# Patient Record
Sex: Female | Born: 1970 | Race: Black or African American | Hispanic: No | Marital: Married | Smoking: Never smoker
Health system: Southern US, Community
[De-identification: ages and names within clinical notes are randomized; demographics above are authoritative.]

## PROBLEM LIST (undated history)

## (undated) DIAGNOSIS — M545 Low back pain, unspecified: Secondary | ICD-10-CM

## (undated) DIAGNOSIS — G43909 Migraine, unspecified, not intractable, without status migrainosus: Secondary | ICD-10-CM

## (undated) DIAGNOSIS — G8929 Other chronic pain: Secondary | ICD-10-CM

---

## 2011-12-08 ENCOUNTER — Encounter (HOSPITAL_BASED_OUTPATIENT_CLINIC_OR_DEPARTMENT_OTHER): Payer: Self-pay

## 2011-12-08 ENCOUNTER — Emergency Department (HOSPITAL_BASED_OUTPATIENT_CLINIC_OR_DEPARTMENT_OTHER): Payer: BC Managed Care – PPO

## 2011-12-08 ENCOUNTER — Emergency Department (HOSPITAL_BASED_OUTPATIENT_CLINIC_OR_DEPARTMENT_OTHER)
Admission: EM | Admit: 2011-12-08 | Discharge: 2011-12-08 | Disposition: A | Discharge status: Home / Self Care | Payer: BC Managed Care – PPO | Attending: Emergency Medicine | Admitting: Emergency Medicine

## 2011-12-08 DIAGNOSIS — R109 Unspecified abdominal pain: Secondary | ICD-10-CM | POA: Insufficient documentation

## 2011-12-08 DIAGNOSIS — N83209 Unspecified ovarian cyst, unspecified side: Secondary | ICD-10-CM | POA: Insufficient documentation

## 2011-12-08 DIAGNOSIS — M549 Dorsalgia, unspecified: Secondary | ICD-10-CM | POA: Insufficient documentation

## 2011-12-08 LAB — COMPREHENSIVE METABOLIC PANEL
ALT: 9 U/L (ref 0–35)
AST: 14 U/L (ref 0–37)
Albumin: 3.7 g/dL (ref 3.5–5.2)
Alkaline Phosphatase: 63 U/L (ref 39–117)
BUN: 12 mg/dL (ref 6–23)
Chloride: 105 mEq/L (ref 96–112)
Potassium: 4.2 mEq/L (ref 3.5–5.1)
Sodium: 138 mEq/L (ref 135–145)
Total Bilirubin: 0.7 mg/dL (ref 0.3–1.2)
Total Protein: 6.9 g/dL (ref 6.0–8.3)

## 2011-12-08 LAB — URINE MICROSCOPIC-ADD ON

## 2011-12-08 LAB — URINALYSIS, ROUTINE W REFLEX MICROSCOPIC
Bilirubin Urine: NEGATIVE
Glucose, UA: NEGATIVE mg/dL
Hgb urine dipstick: NEGATIVE
Specific Gravity, Urine: 1.021 (ref 1.005–1.030)
Urobilinogen, UA: 0.2 mg/dL (ref 0.0–1.0)

## 2011-12-08 LAB — WET PREP, GENITAL: Trich, Wet Prep: NONE SEEN

## 2011-12-08 LAB — CBC
HCT: 33.8 % — ABNORMAL LOW (ref 36.0–46.0)
MCHC: 35.8 g/dL (ref 30.0–36.0)
Platelets: 268 10*3/uL (ref 150–400)
RDW: 12.6 % (ref 11.5–15.5)
WBC: 4.4 10*3/uL (ref 4.0–10.5)

## 2011-12-08 MED ORDER — METRONIDAZOLE 500 MG PO TABS: 2000.0000 mg | ORAL_TABLET | Freq: Once | ORAL | Status: DC

## 2011-12-08 MED ORDER — ONDANSETRON HCL 4 MG/2ML IJ SOLN
INTRAMUSCULAR | Status: AC
  Administered 2011-12-08: 4 mg via INTRAVENOUS
  Filled 2011-12-08: qty 2

## 2011-12-08 MED ORDER — METRONIDAZOLE 500 MG PO TABS: 2000.0000 mg | ORAL_TABLET | Freq: Once | ORAL | Status: AC

## 2011-12-08 MED ORDER — ONDANSETRON HCL 4 MG/2ML IJ SOLN
4.0000 mg | INTRAMUSCULAR | Status: AC
  Administered 2011-12-08: 4 mg via INTRAVENOUS

## 2011-12-08 MED ORDER — NAPROXEN 500 MG PO TABS: 500.0000 mg | ORAL_TABLET | Freq: Two times a day (BID) | ORAL | Status: DC

## 2011-12-08 MED ORDER — HYDROCODONE-ACETAMINOPHEN 5-325 MG PO TABS: 1.0000 | ORAL_TABLET | Freq: Four times a day (QID) | ORAL | Status: DC | PRN

## 2011-12-08 MED ORDER — KETOROLAC TROMETHAMINE 30 MG/ML IJ SOLN
30.0000 mg | Freq: Once | INTRAMUSCULAR | Status: AC
  Administered 2011-12-08: 30 mg via INTRAVENOUS
  Filled 2011-12-08: qty 1

## 2011-12-08 MED ORDER — MORPHINE SULFATE 4 MG/ML IJ SOLN
4.0000 mg | Freq: Once | INTRAMUSCULAR | Status: AC
  Administered 2011-12-08: 4 mg via INTRAVENOUS
  Filled 2011-12-08: qty 1

## 2011-12-08 NOTE — ED Provider Notes (Signed)
History     CSN: [id number]  Arrival date & time 12/08/11  0601   First MD Initiated Contact with Patient 12/08/11 0630      Chief Complaint  Patient presents with  . Abdominal Pain    (Consider location/radiation/quality/duration/timing/severity/associated sxs/prior treatment) HPI Comments: Stacey Hancock presents ambulatory for evaluation of abdominal pain. She reports she awoke at roughly 0500 and went to the bathroom. She had a strong cramping lower abdominal discomfort that radiated to her rectum. She states the pain is severe and made her double over. Shortly thereafter, it eased-off some but she continues to have significant discomfort. She denies any nausea, vomiting, diarrhea, constipation, dysuria, pyuria, hematuria, vaginal bleeding, or discharges. She denies any previous surgeries. She takes no medications on a daily basis.  LNMP occurred around the 20th of September.  Patient is a 41 y.o. female presenting with abdominal pain. The history is provided by the patient. No language interpreter was used.  Abdominal Pain The primary symptoms of the illness include abdominal pain. The primary symptoms of the illness do not include fever, fatigue, shortness of breath, nausea, vomiting, diarrhea, hematemesis, hematochezia, dysuria, vaginal discharge or vaginal bleeding. The current episode started 1 to 2 hours ago. The onset of the illness was sudden.  The patient states that she believes she is currently not pregnant. The patient has not had a change in bowel habit. Additional symptoms associated with the illness include frequency and back pain. Symptoms associated with the illness do not include chills, anorexia, diaphoresis, heartburn, constipation, urgency or hematuria. Significant associated medical issues do not include PUD, GERD, inflammatory bowel disease, diabetes, sickle cell disease, gallstones, liver disease, substance abuse, diverticulitis, HIV or cardiac disease.    History  reviewed. No pertinent past medical history.  History reviewed. No pertinent past surgical history.  No family history on file.  History  Substance Use Topics  . Smoking status: Never Smoker   . Smokeless tobacco: Not on file  . Alcohol Use:     OB History    Grav Para Term Preterm Abortions TAB SAB Ect Mult Living                  Review of Systems  Constitutional: Negative for fever, chills, diaphoresis and fatigue.  Respiratory: Negative for shortness of breath.   Gastrointestinal: Positive for abdominal pain. Negative for heartburn, nausea, vomiting, diarrhea, constipation, hematochezia, anorexia and hematemesis.  Genitourinary: Positive for frequency. Negative for dysuria, urgency, hematuria, vaginal bleeding and vaginal discharge.  Musculoskeletal: Positive for back pain.  All other systems reviewed and are negative.    Allergies  Review of patient's allergies indicates no known allergies.  Home Medications  No current outpatient prescriptions on file.  BP 117/82  Pulse 72  Temp 97.8 F (36.6 C)  Resp 18  SpO2 100%  LMP 11/18/2011  Physical Exam  Nursing note and vitals reviewed. Constitutional: She is oriented to person, place, and time. She appears well-developed and well-nourished. No distress.  HENT:  Head: Normocephalic and atraumatic.  Right Ear: External ear normal.  Left Ear: External ear normal.  Nose: Nose normal.  Mouth/Throat: Oropharynx is clear and moist. No oropharyngeal exudate.  Eyes: Conjunctivae normal are normal. Pupils are equal, round, and reactive to light. Right eye exhibits no discharge. Left eye exhibits no discharge. No scleral icterus.  Neck: Normal range of motion. Neck supple. No JVD present. No tracheal deviation present.  Cardiovascular: Normal rate, regular rhythm, normal heart sounds and intact distal pulses.  Exam reveals no gallop and no friction rub.   No murmur heard. Pulmonary/Chest: Effort normal and breath sounds  normal. No stridor. No respiratory distress.  Abdominal: Soft. Normal appearance and bowel sounds are normal. She exhibits no distension, no pulsatile liver, no abdominal bruit, no ascites, no pulsatile midline mass and no mass. There is no hepatosplenomegaly. There is tenderness (diffese lower abdomen shaded towards the RLQ). There is no rebound, no guarding and no CVA tenderness. No hernia. Hernia confirmed negative in the right inguinal area and confirmed negative in the left inguinal area.  Genitourinary: Rectal exam shows no external hemorrhoid. Pelvic exam was performed with patient supine. No labial fusion. There is no rash, tenderness, lesion or injury on the right labia. There is no rash, tenderness, lesion or injury on the left labia. Uterus is not deviated, not enlarged, not fixed and not tender. Cervix exhibits no motion tenderness, no discharge and no friability. Right adnexum displays tenderness. Right adnexum displays no mass and no fullness. Left adnexum displays no mass, no tenderness and no fullness. No erythema, tenderness or bleeding around the vagina. No foreign body around the vagina. No signs of injury around the vagina. Vaginal discharge found.       Severe Rt adnexal tenderness without palpable fullness or mass.  Musculoskeletal: Normal range of motion. She exhibits no edema and no tenderness.  Lymphadenopathy:    She has no cervical adenopathy.       Right: No inguinal adenopathy present.       Left: No inguinal adenopathy present.  Neurological: She is alert and oriented to person, place, and time.  Skin: Skin is warm and dry. No rash noted. She is not diaphoretic. No cyanosis or erythema. No pallor. Nails show no clubbing.  Psychiatric: She has a normal mood and affect. Her behavior is normal.    ED Course  Procedures (including critical care time)  Labs Reviewed  URINALYSIS, ROUTINE W REFLEX MICROSCOPIC - Abnormal; Notable for the following:    APPearance CLOUDY (*)       Leukocytes, UA TRACE (*)     All other components within normal limits  URINE MICROSCOPIC-ADD ON - Abnormal; Notable for the following:    Squamous Epithelial / LPF FEW (*)     Bacteria, UA MANY (*)     All other components within normal limits  PREGNANCY, URINE   No results found.   No diagnosis found.    MDM  Pt presents for evaluation of cramping lower abdominal pain.  Note stable VS, NAD.  Pt has significant rt adnexal tenderness on exam.  Note neg u-preg and a nl U/A.  Plan CBC, CMP, pelvic protocol labs.  Secondary to adnexal pain, ordered a pelvic ultrasound.  Will also offer pain meds prn.     0730.  Pt stable, NAD.  Labs and U/S pending.  Pt signed over to oncoming provider.  He will follow-up all results and provide an appropriate disposition.         Tobin Chad, MD 12/08/11 [phone]

## 2011-12-08 NOTE — ED Provider Notes (Signed)
Physical Exam  BP 102/73  Pulse 52  Temp 97.8 F (36.6 C)  Resp 18  SpO2 98%  LMP 11/15/2011  Physical Exam Please see Dr. Lorenso Courier no ED Course  Procedures US Transvaginal Non-ob  12/08/2011  *RADIOLOGY REPORT*  Clinical Data: Pelvic pain.  Right adnexal tenderness.  TRANSABDOMINAL AND TRANSVAGINAL ULTRASOUND OF PELVIS  Technique:  Both transabdominal and transvaginal ultrasound examinations of the pelvis were performed.  Transabdominal technique was performed for global imaging of the pelvis including uterus, ovaries, adnexal regions, and pelvic cul-de-sac.  It was necessary to proceed with endovaginal exam following the transabdominal exam to visualize the ovaries and cystic lesion in the left adnexa.  Comparison:  None.  Findings: Uterus:  9.8 x 4.7 x 7.3 cm.  Heterogeneous echotexture uterine myometrium noted.  No fibroids identified.  A few tiny cystic foci are seen in the deep myometrial junctional zone, consistent with adenomyosis.  Endometrium: Double layer thickness measures 5 mm transvaginally. No focal lesion visualized.  Right ovary: 2.3 x 1.8 x 2.6 cm.  Normal appearance.  Left ovary: 4.9 x 2.6 x 4.5 cm.  A complex cystic lesion is seen containing several internal septations as well as low level internal echoes.  No internal blood flow is seen within this lesion on color Doppler ultrasound.  This measures 2.9 x 2.6 x 2.9 cm. This has indeterminate, but probably benign characteristics.  Other Findings:  No free fluid  IMPRESSION:  1.  2.9 cm complex left ovariancyst, with indeterminate but probably benign characteristics.  Follow-up by pelvic ultrasound is recommended in 6-12 weeks. 2.  Uterine adenomyosis.  No evidence of fibroids.  This recommendation follows the consensus statement:  Management of Asymptomatic Ovarian and Other Adnexal Cysts Imaged at [place]:  Society of Radiologists in Ultrasound Consensus Conference Statement. Radiology 2010; [phone].   Original Report Authenticated  By: Danae Orleans, M.D.    US Pelvis Complete  12/08/2011  *RADIOLOGY REPORT*  Clinical Data: Pelvic pain.  Right adnexal tenderness.  TRANSABDOMINAL AND TRANSVAGINAL ULTRASOUND OF PELVIS  Technique:  Both transabdominal and transvaginal ultrasound examinations of the pelvis were performed.  Transabdominal technique was performed for global imaging of the pelvis including uterus, ovaries, adnexal regions, and pelvic cul-de-sac.  It was necessary to proceed with endovaginal exam following the transabdominal exam to visualize the ovaries and cystic lesion in the left adnexa.  Comparison:  None.  Findings: Uterus:  9.8 x 4.7 x 7.3 cm.  Heterogeneous echotexture uterine myometrium noted.  No fibroids identified.  A few tiny cystic foci are seen in the deep myometrial junctional zone, consistent with adenomyosis.  Endometrium: Double layer thickness measures 5 mm transvaginally. No focal lesion visualized.  Right ovary: 2.3 x 1.8 x 2.6 cm.  Normal appearance.  Left ovary: 4.9 x 2.6 x 4.5 cm.  A complex cystic lesion is seen containing several internal septations as well as low level internal echoes.  No internal blood flow is seen within this lesion on color Doppler ultrasound.  This measures 2.9 x 2.6 x 2.9 cm. This has indeterminate, but probably benign characteristics.  Other Findings:  No free fluid  IMPRESSION:  1.  2.9 cm complex left ovariancyst, with indeterminate but probably benign characteristics.  Follow-up by pelvic ultrasound is recommended in 6-12 weeks. 2.  Uterine adenomyosis.  No evidence of fibroids.  This recommendation follows the consensus statement:  Management of Asymptomatic Ovarian and Other Adnexal Cysts Imaged at [place]:  Society of Radiologists in Ultrasound Consensus Conference Statement.  Radiology 2010; [phone].   Original Report Authenticated By: Danae Orleans, M.D.     MDM Patient has a complex ovarian cyst on the left side. I discussed with her the findings the recommendation for  followup ultrasound to patient states her pain is more of on the right side which would argue against this being related to her symptoms. Patient does mention she has had years of pelvic pain associated with intercourse.  She'll be treated with Flagyl for possible bacterial vaginosis. Prescriptions for pain medications will be provided to At this time there does not appear to be any evidence of an acute emergency medical condition and the patient appears stable for discharge with appropriate outpatient follow up.       Celene Kras, MD 12/08/11 [phone]

## 2011-12-08 NOTE — ED Notes (Signed)
Returned from Radiology.

## 2011-12-08 NOTE — ED Notes (Signed)
Patient complains of lower abdominal pain that she describes as cramping pain. No nausea or diarrhea. Reports that she ate dinner late and thought it was related to that.

## 2013-07-09 IMAGING — US US PELVIS COMPLETE
1 series · 13 of 25 positions shown · non-contrast
Comparison: None.

CLINICAL DATA: Pelvic pain.  Right adnexal tenderness.

TRANSABDOMINAL AND TRANSVAGINAL ULTRASOUND OF PELVIS
TECHNIQUE: Both transabdominal and transvaginal ultrasound
examinations of the pelvis were performed.  Transabdominal
technique was performed for global imaging of the pelvis including
uterus, ovaries, adnexal regions, and pelvic cul-de-sac.
It was necessary to proceed with endovaginal exam following the
transabdominal exam to visualize the ovaries and cystic lesion in
the left adnexa.

[Series 1: us pelvis complete · 0.29mm/px · 13 of 30 slices shown]
[im 1/30]
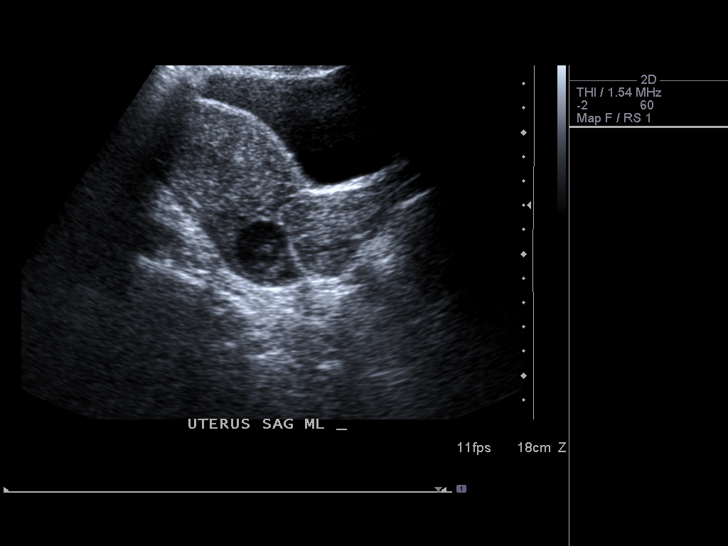
[im 3/30]
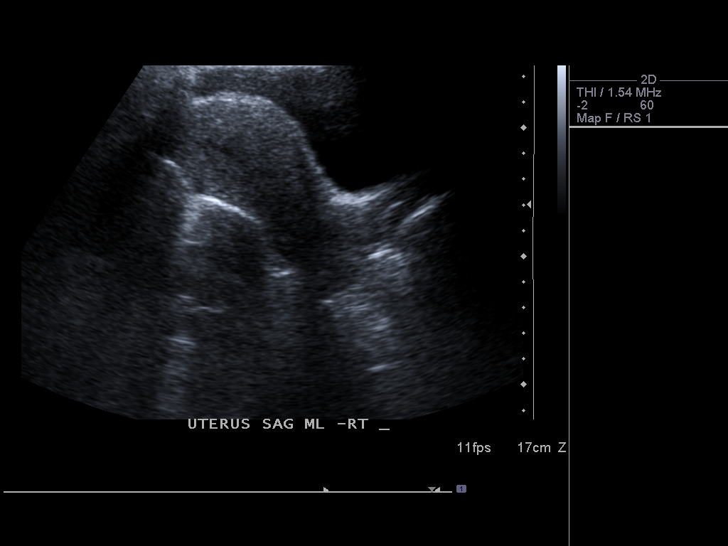
[im 5/30]
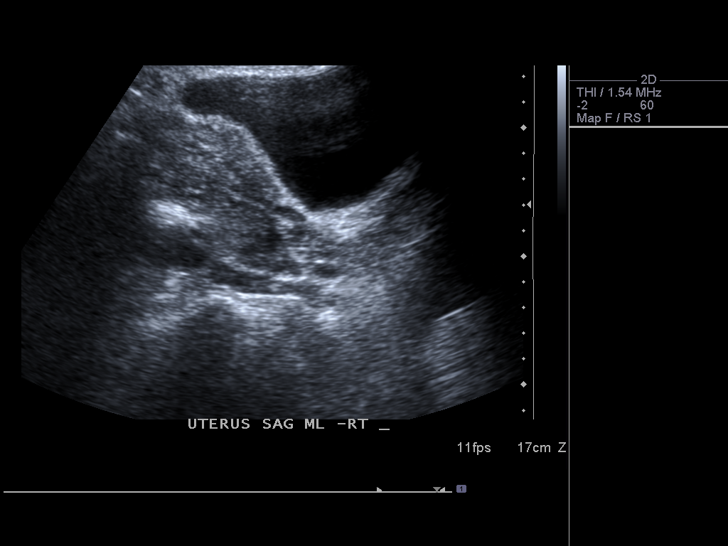
[im 8/30]
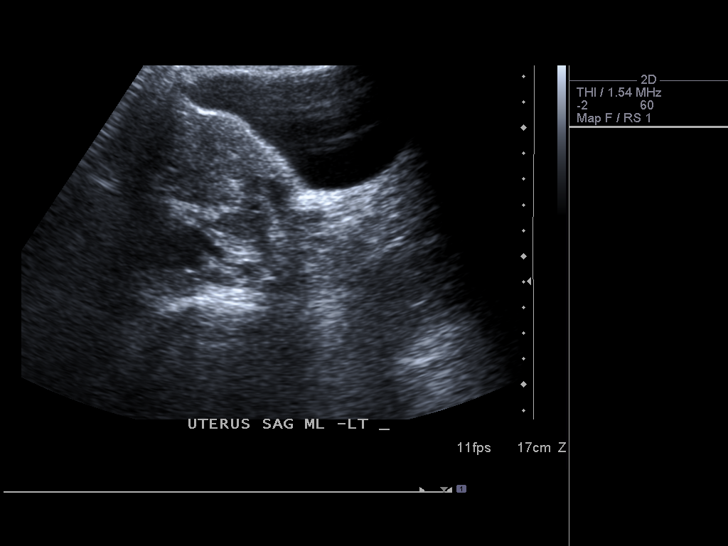
[im 10/30]
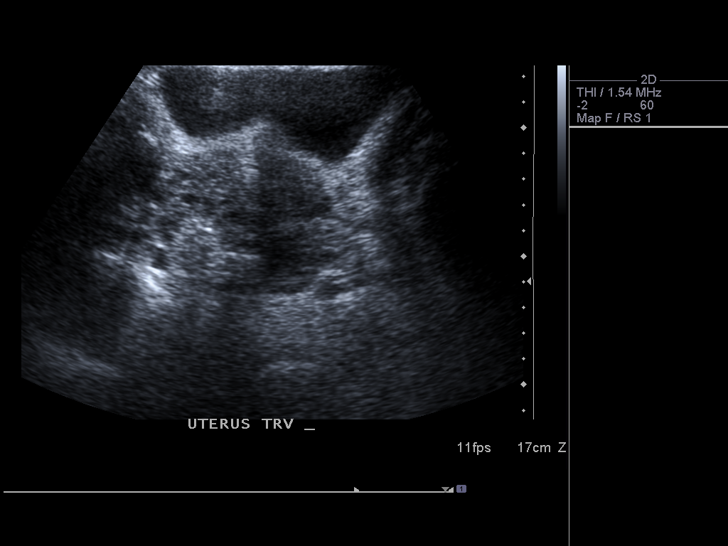
[im 13/30]
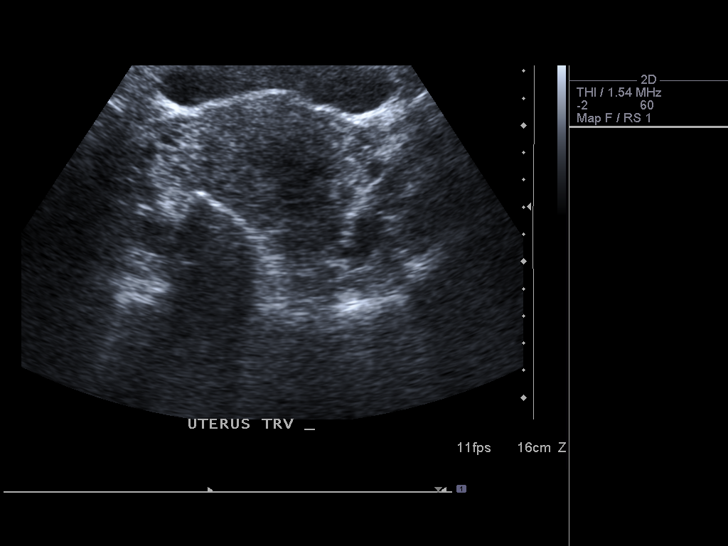
[im 15/30]
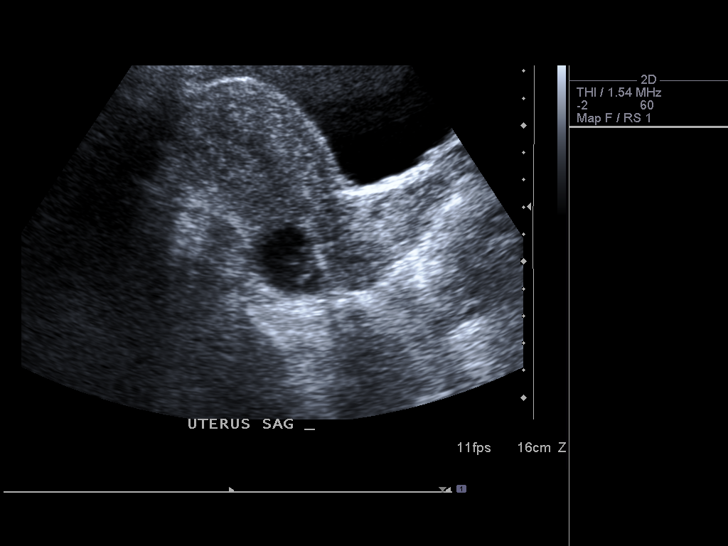
[im 17/30]
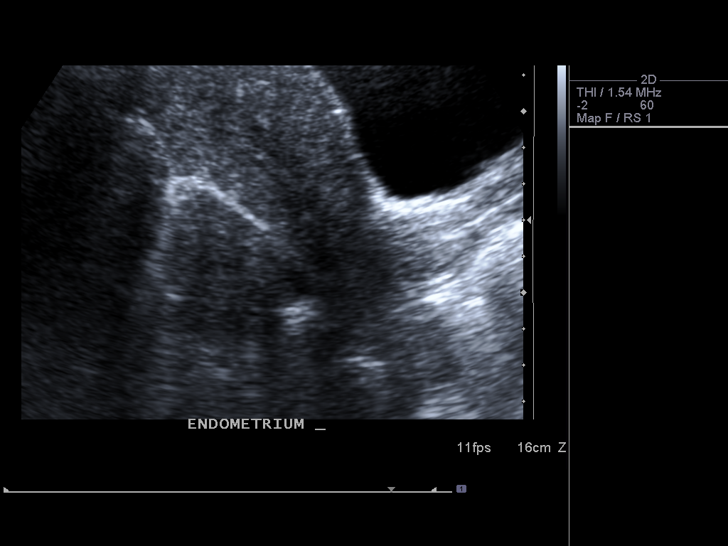
[im 20/30]
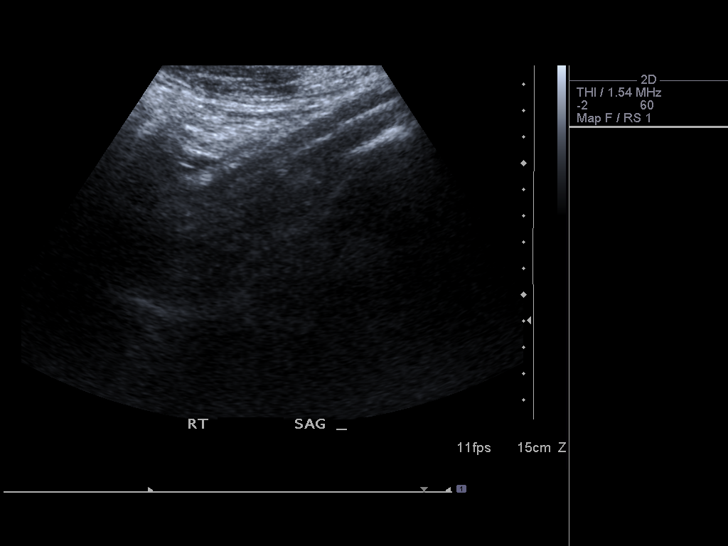
[im 22/30]
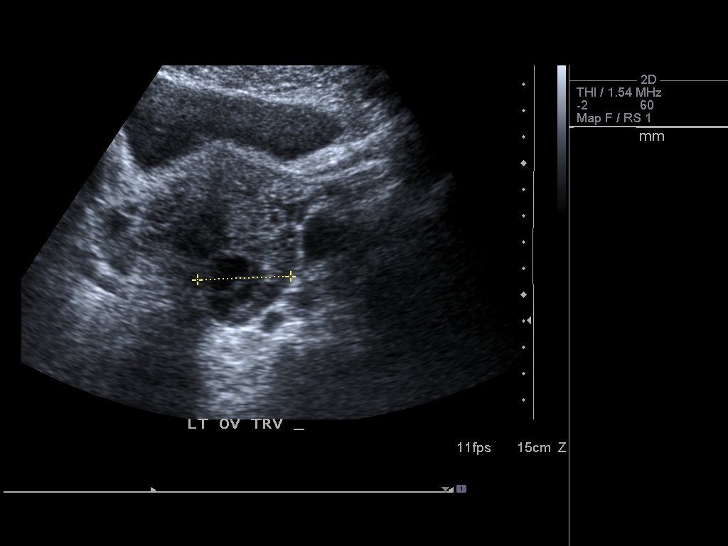
[im 25/30]
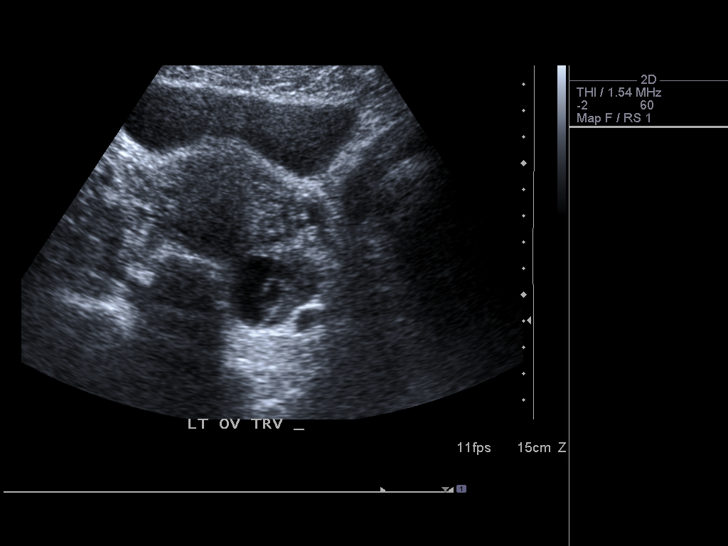
[im 27/30]
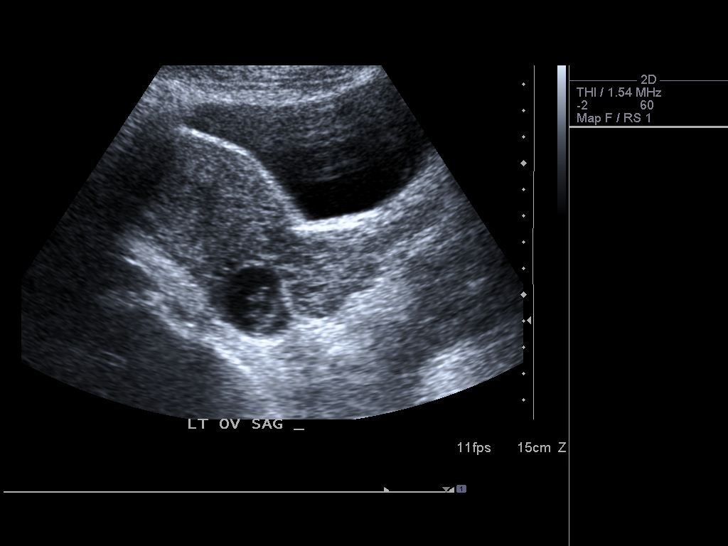
[im 30/30]
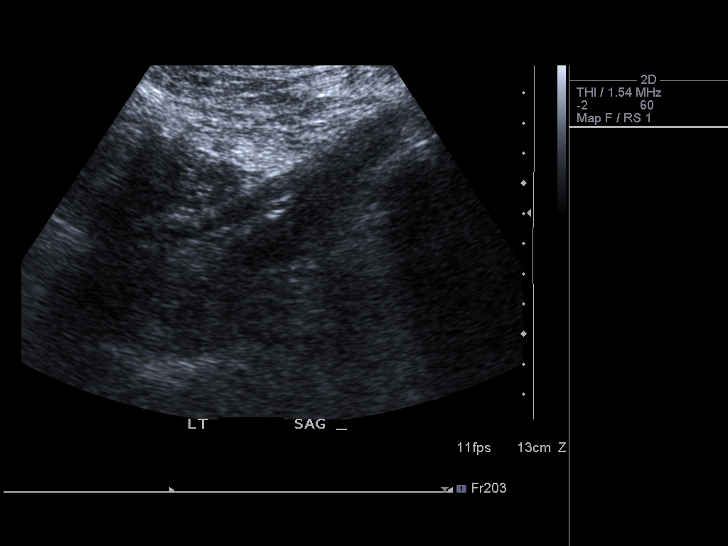

[13 of 25 positions shown; findings below may reference images not displayed]

FINDINGS: Uterus:  9.8 x 4.7 x 7.3 cm.  Heterogeneous echotexture uterine
myometrium noted.  No fibroids identified.  A few tiny cystic foci
are seen in the deep myometrial junctional zone, consistent with
adenomyosis.

Endometrium: Double layer thickness measures 5 mm transvaginally.
No focal lesion visualized.

Right ovary: 2.3 x 1.8 x 2.6 cm.  Normal appearance.

Left ovary: 4.9 x 2.6 x 4.5 cm.  A complex cystic lesion is seen
containing several internal septations as well as low level
internal echoes.  No internal blood flow is seen within this lesion
on color Doppler ultrasound.  This measures 2.9 x 2.6 x 2.9 cm.
This has indeterminate, but probably benign characteristics.

Other Findings:  No free fluid
IMPRESSION: 1.  2.9 cm complex left ovariancyst, with indeterminate but
probably benign characteristics.  Follow-up by pelvic ultrasound is
recommended in 6-12 weeks.
2.  Uterine adenomyosis.  No evidence of fibroids.

This recommendation follows the consensus statement:  Management of
Asymptomatic Ovarian and Other Adnexal Cysts Imaged at [place]:  Society
of Radiologists in Ultrasound Consensus Conference Statement.

## 2013-07-09 IMAGING — US US PELVIS COMPLETE
1 series · 13 of 25 positions shown · non-contrast
Comparison: None.

CLINICAL DATA: Pelvic pain.  Right adnexal tenderness.

TRANSABDOMINAL AND TRANSVAGINAL ULTRASOUND OF PELVIS
TECHNIQUE: Both transabdominal and transvaginal ultrasound
examinations of the pelvis were performed.  Transabdominal
technique was performed for global imaging of the pelvis including
uterus, ovaries, adnexal regions, and pelvic cul-de-sac.
It was necessary to proceed with endovaginal exam following the
transabdominal exam to visualize the ovaries and cystic lesion in
the left adnexa.

[Series 1: us pelvis complete · 0.17mm/px · 13 of 60 slices shown]
[im 1/60]
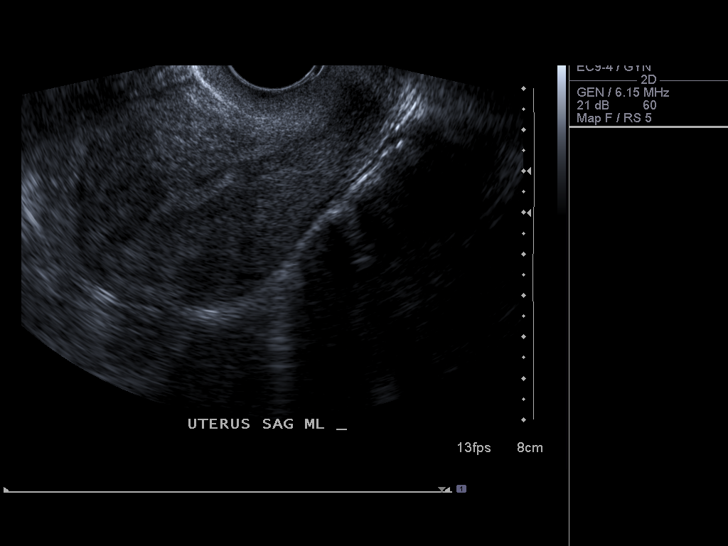
[im 5/60]
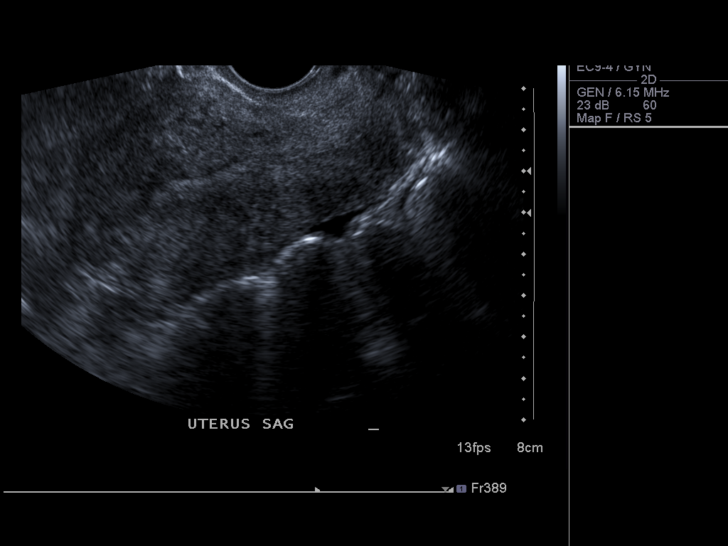
[im 10/60]
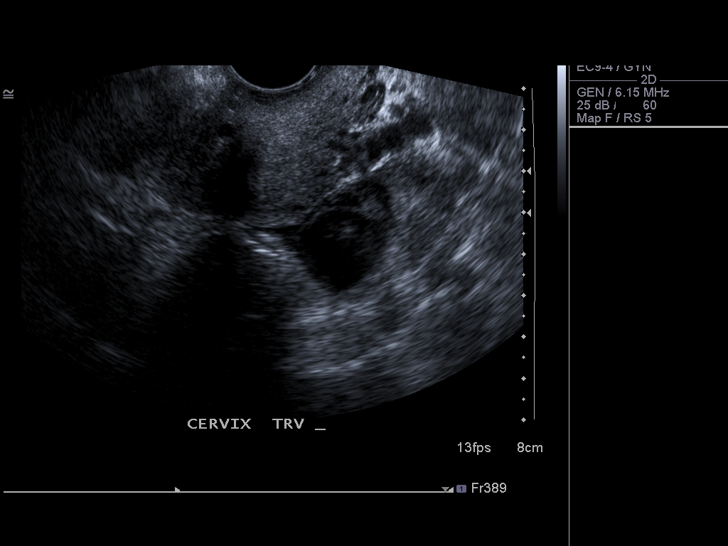
[im 15/60]
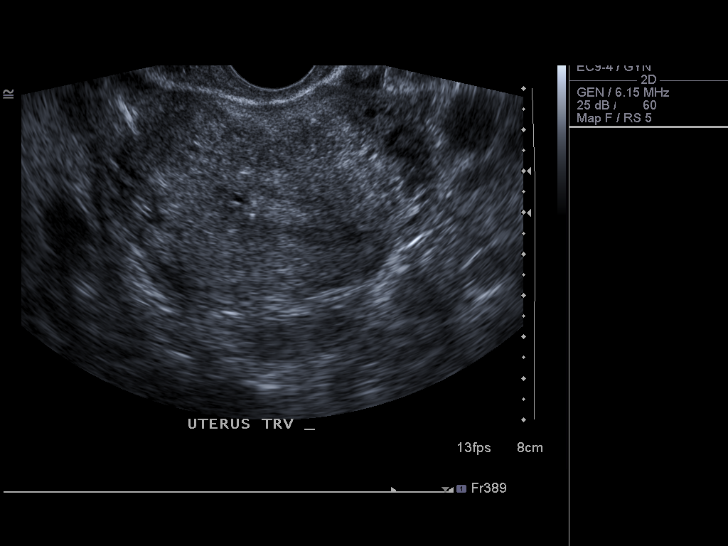
[im 20/60]
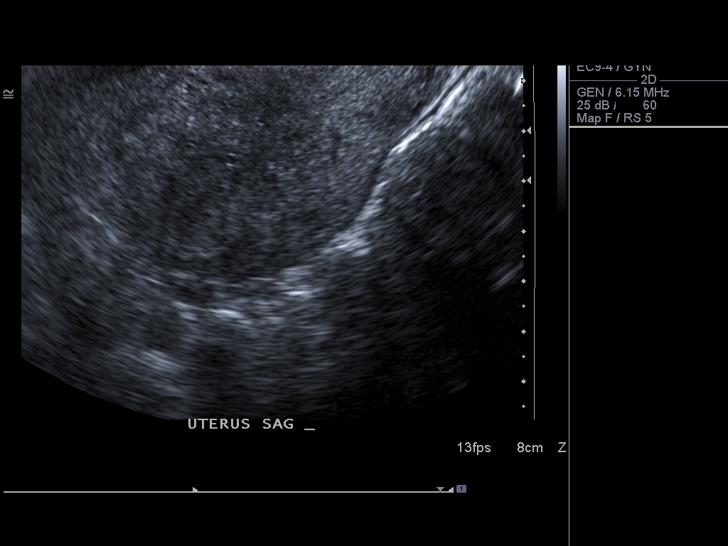
[im 25/60]
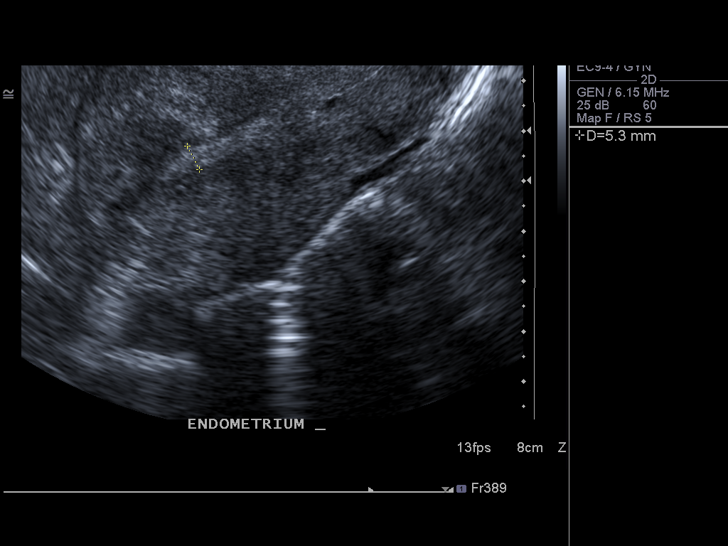
[im 30/60]
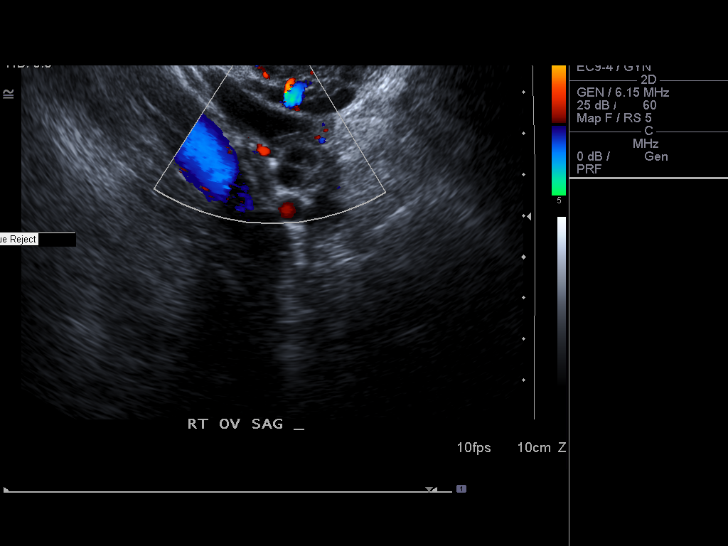
[im 35/60]
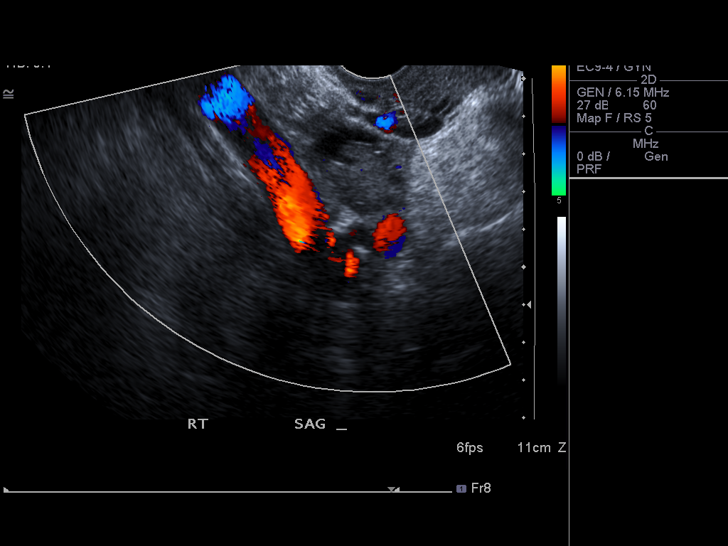
[im 40/60]
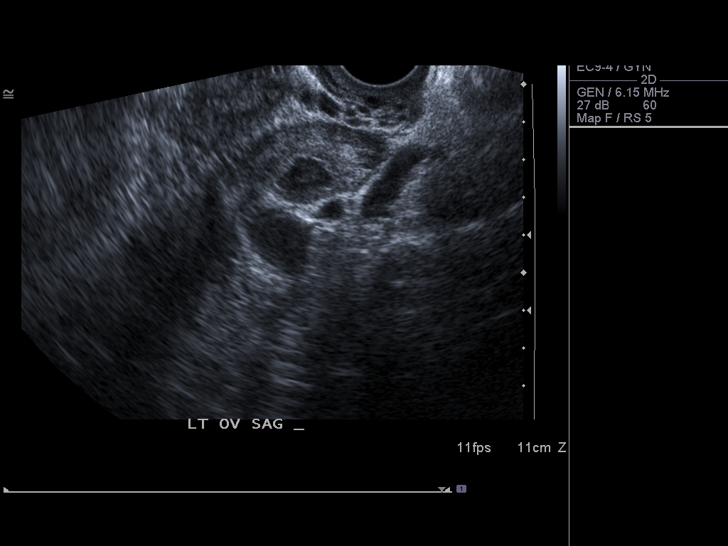
[im 45/60]
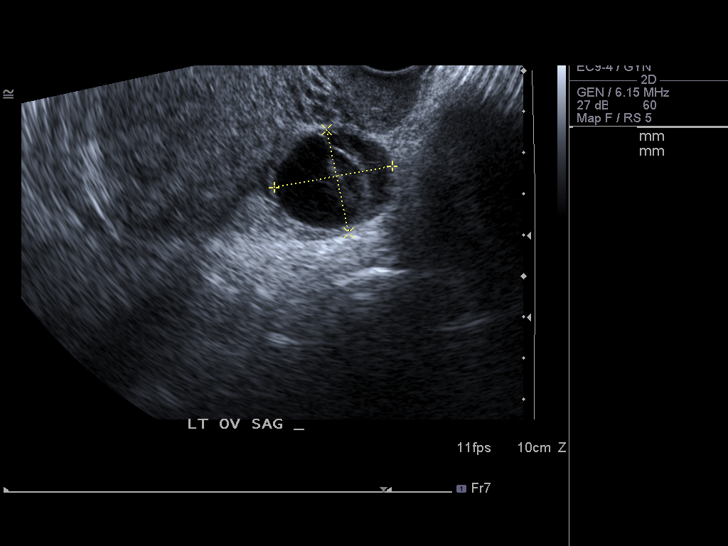
[im 50/60]
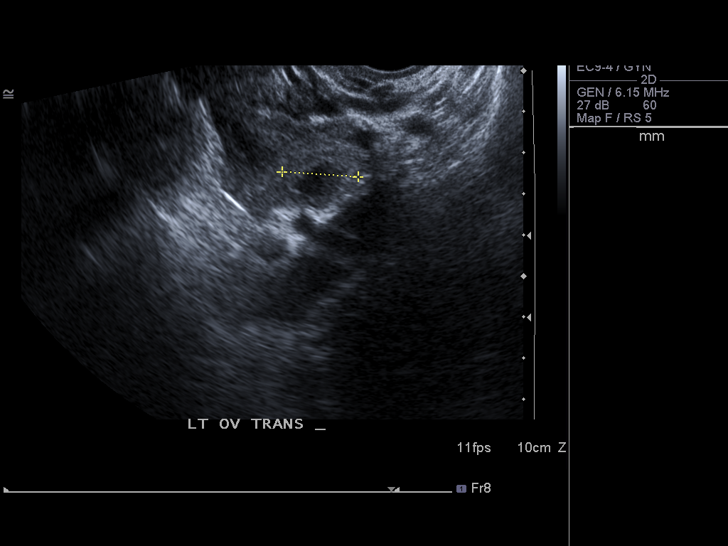
[im 55/60]
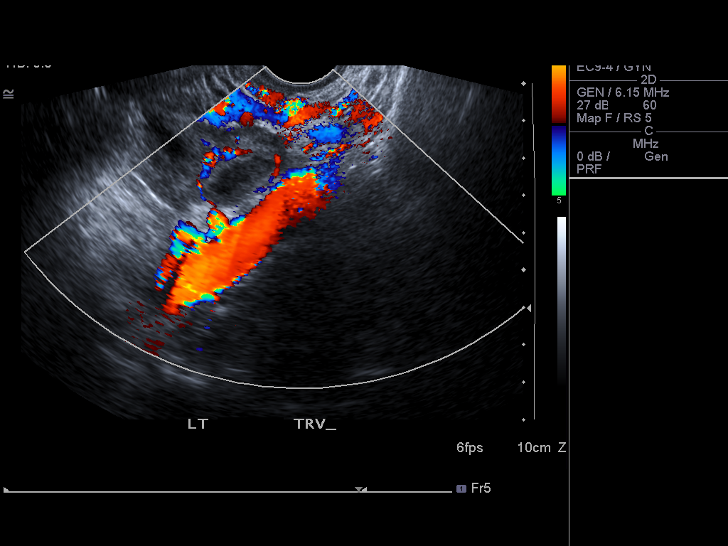
[im 60/60]
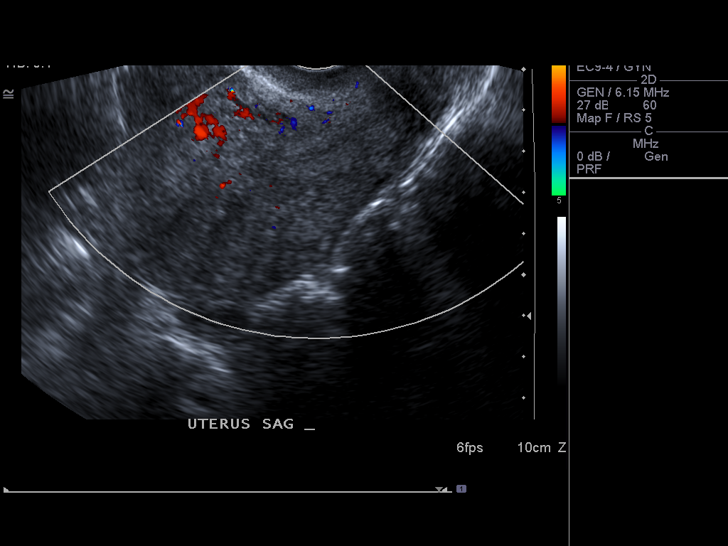

[13 of 25 positions shown; findings below may reference images not displayed]

FINDINGS: Uterus:  9.8 x 4.7 x 7.3 cm.  Heterogeneous echotexture uterine
myometrium noted.  No fibroids identified.  A few tiny cystic foci
are seen in the deep myometrial junctional zone, consistent with
adenomyosis.

Endometrium: Double layer thickness measures 5 mm transvaginally.
No focal lesion visualized.

Right ovary: 2.3 x 1.8 x 2.6 cm.  Normal appearance.

Left ovary: 4.9 x 2.6 x 4.5 cm.  A complex cystic lesion is seen
containing several internal septations as well as low level
internal echoes.  No internal blood flow is seen within this lesion
on color Doppler ultrasound.  This measures 2.9 x 2.6 x 2.9 cm.
This has indeterminate, but probably benign characteristics.

Other Findings:  No free fluid
IMPRESSION: 1.  2.9 cm complex left ovariancyst, with indeterminate but
probably benign characteristics.  Follow-up by pelvic ultrasound is
recommended in 6-12 weeks.
2.  Uterine adenomyosis.  No evidence of fibroids.

This recommendation follows the consensus statement:  Management of
Asymptomatic Ovarian and Other Adnexal Cysts Imaged at [place]:  Society
of Radiologists in Ultrasound Consensus Conference Statement.

## 2014-05-22 ENCOUNTER — Encounter (HOSPITAL_BASED_OUTPATIENT_CLINIC_OR_DEPARTMENT_OTHER): Payer: Self-pay | Admitting: *Deleted

## 2014-05-22 ENCOUNTER — Emergency Department (HOSPITAL_BASED_OUTPATIENT_CLINIC_OR_DEPARTMENT_OTHER)
Admission: EM | Admit: 2014-05-22 | Discharge: 2014-05-22 | Disposition: A | Discharge status: Home / Self Care | Payer: BLUE CROSS/BLUE SHIELD | Attending: Emergency Medicine | Admitting: Emergency Medicine

## 2014-05-22 DIAGNOSIS — Z3202 Encounter for pregnancy test, result negative: Secondary | ICD-10-CM | POA: Diagnosis not present

## 2014-05-22 DIAGNOSIS — Z791 Long term (current) use of non-steroidal anti-inflammatories (NSAID): Secondary | ICD-10-CM | POA: Diagnosis not present

## 2014-05-22 DIAGNOSIS — R1030 Lower abdominal pain, unspecified: Secondary | ICD-10-CM | POA: Diagnosis present

## 2014-05-22 DIAGNOSIS — N390 Urinary tract infection, site not specified: Secondary | ICD-10-CM | POA: Diagnosis not present

## 2014-05-22 LAB — URINALYSIS, ROUTINE W REFLEX MICROSCOPIC
BILIRUBIN URINE: NEGATIVE
GLUCOSE, UA: NEGATIVE mg/dL
Ketones, ur: NEGATIVE mg/dL
NITRITE: NEGATIVE
PH: 6.5 (ref 5.0–8.0)
PROTEIN: 30 mg/dL — AB
Specific Gravity, Urine: 1.014 (ref 1.005–1.030)
UROBILINOGEN UA: 0.2 mg/dL (ref 0.0–1.0)

## 2014-05-22 LAB — URINE MICROSCOPIC-ADD ON

## 2014-05-22 LAB — PREGNANCY, URINE: PREG TEST UR: NEGATIVE

## 2014-05-22 MED ORDER — PHENAZOPYRIDINE HCL 200 MG PO TABS: 200.0000 mg | ORAL_TABLET | Freq: Three times a day (TID) | ORAL | Status: AC

## 2014-05-22 MED ORDER — CEPHALEXIN 250 MG PO CAPS
500.0000 mg | ORAL_CAPSULE | Freq: Once | ORAL | Status: AC
  Administered 2014-05-22: 500 mg via ORAL
  Filled 2014-05-22: qty 2

## 2014-05-22 MED ORDER — PHENAZOPYRIDINE HCL 100 MG PO TABS
200.0000 mg | ORAL_TABLET | Freq: Once | ORAL | Status: AC
  Administered 2014-05-22: 200 mg via ORAL
  Filled 2014-05-22: qty 2

## 2014-05-22 MED ORDER — CEPHALEXIN 500 MG PO CAPS: 500.0000 mg | ORAL_CAPSULE | Freq: Two times a day (BID) | ORAL | Status: AC

## 2014-05-22 NOTE — ED Provider Notes (Signed)
CSN: [id number]     Arrival date & time 05/22/14  1921 History   First MD Initiated Contact with Patient 05/22/14 2034     Chief Complaint  Patient presents with  . Abdominal Pain     (Consider location/radiation/quality/duration/timing/severity/associated sxs/prior Treatment) HPI   Stacey Hancock is a 44 y.o. female complaining of urinary frequency, sensation of increased leak void and pulling sensation when she urinates. She reports a mild, lower, midline abdominal pain and low back pain. Symptoms are worsening over the course of 2 days. She denies fever, chills, nausea, vomiting, abnormal vaginal discharge, flank pain, diarrhea or constipation.  History reviewed. No pertinent past medical history. History reviewed. No pertinent past surgical history. No family history on file. History  Substance Use Topics  . Smoking status: Never Smoker   . Smokeless tobacco: Not on file  . Alcohol Use: Yes   OB History    No data available     Review of Systems  10 systems reviewed and found to be negative, except as noted in the HPI.   Allergies  Review of patient's allergies indicates no known allergies.  Home Medications   Prior to Admission medications   Medication Sig Start Date End Date Taking? Authorizing Provider  HYDROcodone-acetaminophen (NORCO) 5-325 MG per tablet Take 1-2 tablets by mouth every 6 (six) hours as needed for pain. 12/08/11   Linwood DibblesJon Knapp, MD  metroNIDAZOLE (FLAGYL) 500 MG tablet Take 4 tablets (2,000 mg total) by mouth once. 12/08/11   Linwood DibblesJon Knapp, MD  naproxen (NAPROSYN) 500 MG tablet Take 1 tablet (500 mg total) by mouth 2 (two) times daily. 12/08/11   Linwood DibblesJon Knapp, MD   BP 119/70 mmHg  Pulse 76  Temp(Src) 98.6 F (37 C) (Oral)  Resp 18  Ht 5' 6.5" (1.689 m)  Wt 200 lb (90.719 kg)  BMI 31.80 kg/m2  SpO2 99%  LMP 05/07/2014 (Approximate) Physical Exam  Constitutional: She is oriented to person, place, and time. She appears well-developed and  well-nourished. No distress.  HENT:  Head: Normocephalic.  Mouth/Throat: Oropharynx is clear and moist.  Eyes: Conjunctivae and EOM are normal. Pupils are equal, round, and reactive to light.  Cardiovascular: Normal rate, regular rhythm and intact distal pulses.   Pulmonary/Chest: Effort normal and breath sounds normal. No stridor.  Abdominal: Soft. Bowel sounds are normal. She exhibits no distension and no mass. There is tenderness. There is no rebound and no guarding.  Mild suprapubic tenderness palpation with no guarding or rebound.  Genitourinary:  No CVA tenderness palpation bilaterally  Musculoskeletal: Normal range of motion. She exhibits no edema.  Neurological: She is alert and oriented to person, place, and time.  Psychiatric: She has a normal mood and affect.  Nursing note and vitals reviewed.   ED Course  Procedures (including critical care time) Labs Review Labs Reviewed  URINALYSIS, ROUTINE W REFLEX MICROSCOPIC - Abnormal; Notable for the following:    APPearance CLOUDY (*)    Hgb urine dipstick MODERATE (*)    Protein, ur 30 (*)    Leukocytes, UA MODERATE (*)    All other components within normal limits  URINE MICROSCOPIC-ADD ON - Abnormal; Notable for the following:    Squamous Epithelial / LPF FEW (*)    Bacteria, UA MANY (*)    All other components within normal limits  PREGNANCY, URINE    Imaging Review No results found.   EKG Interpretation None      MDM   Final diagnoses:  UTI (  lower urinary tract infection)   Filed Vitals:   05/22/14 2024  BP: 119/70  Pulse: 76  Temp: 98.6 F (37 C)  TempSrc: Oral  Resp: 18  Height: 5' 6.5" (1.689 m)  Weight: 200 lb (90.719 kg)  SpO2: 99%    Medications  cephALEXin (KEFLEX) capsule 500 mg (not administered)  phenazopyridine (PYRIDIUM) tablet 200 mg (not administered)    Stacey Hancock is a pleasant 44 y.o. female presenting with urinary frequency, discomfort with voiding, mild lower abdominal  and low back pain. Patient is afebrile, well-appearing, tolerating by mouth, urinalysis consistent with infection. Patient will be given Keflex and Azo, return precautions for pyelonephritis discussed.  Evaluation does not show pathology that would require ongoing emergent intervention or inpatient treatment. Pt is hemodynamically stable and mentating appropriately. Discussed findings and plan with patient/guardian, who agrees with care plan. All questions answered. Return precautions discussed and outpatient follow up given.   New Prescriptions   CEPHALEXIN (KEFLEX) 500 MG CAPSULE    Take 1 capsule (500 mg total) by mouth 2 (two) times daily.   PHENAZOPYRIDINE (PYRIDIUM) 200 MG TABLET    Take 1 tablet (200 mg total) by mouth 3 (three) times daily.         Wynetta Emery, PA-C 05/22/14 2046  Purvis Sheffield, MD 05/22/14 [phone]

## 2014-05-22 NOTE — ED Notes (Signed)
C/o dysuria, "blood when I wipe", abd pain and back pain. Sx onset Thursday. "think it might be a UTI", h/o distant subjective UTI. no meds PTA. Also reports nausea. Denies vd.

## 2014-05-22 NOTE — Discharge Instructions (Signed)
Take your antibiotics as directed and to completion. You should never have any leftover antibiotics! Push fluids and stay well hydrated.   Any antibiotic use can reduce the efficacy of hormonal birth control. Please use back up method of contraception.   Do not hesitate to return to the emergency room for any new, worsening or concerning symptoms.  Please obtain primary care using resource guide below. But the minute you were seen in the emergency room and that they will need to obtain records for further outpatient management.   Urinary Tract Infection Urinary tract infections (UTIs) can develop anywhere along your urinary tract. Your urinary tract is your body's drainage system for removing wastes and extra water. Your urinary tract includes two kidneys, two ureters, a bladder, and a urethra. Your kidneys are a pair of bean-shaped organs. Each kidney is about the size of your fist. They are located below your ribs, one on each side of your spine. CAUSES Infections are caused by microbes, which are microscopic organisms, including fungi, viruses, and bacteria. These organisms are so small that they can only be seen through a microscope. Bacteria are the microbes that most commonly cause UTIs. SYMPTOMS  Symptoms of UTIs may vary by age and gender of the patient and by the location of the infection. Symptoms in young women typically include a frequent and intense urge to urinate and a painful, burning feeling in the bladder or urethra during urination. Older women and men are more likely to be tired, shaky, and weak and have muscle aches and abdominal pain. A fever may mean the infection is in your kidneys. Other symptoms of a kidney infection include pain in your back or sides below the ribs, nausea, and vomiting. DIAGNOSIS To diagnose a UTI, your caregiver will ask you about your symptoms. Your caregiver also will ask to provide a urine sample. The urine sample will be tested for bacteria and  white blood cells. White blood cells are made by your body to help fight infection. TREATMENT  Typically, UTIs can be treated with medication. Because most UTIs are caused by a bacterial infection, they usually can be treated with the use of antibiotics. The choice of antibiotic and length of treatment depend on your symptoms and the type of bacteria causing your infection. HOME CARE INSTRUCTIONS  If you were prescribed antibiotics, take them exactly as your caregiver instructs you. Finish the medication even if you feel better after you have only taken some of the medication.  Drink enough water and fluids to keep your urine clear or pale yellow.  Avoid caffeine, tea, and carbonated beverages. They tend to irritate your bladder.  Empty your bladder often. Avoid holding urine for long periods of time.  Empty your bladder before and after sexual intercourse.  After a bowel movement, women should cleanse from front to back. Use each tissue only once. SEEK MEDICAL CARE IF:   You have back pain.  You develop a fever.  Your symptoms do not begin to resolve within 3 days. SEEK IMMEDIATE MEDICAL CARE IF:   You have severe back pain or lower abdominal pain.  You develop chills.  You have nausea or vomiting.  You have continued burning or discomfort with urination. MAKE SURE YOU:   Understand these instructions.  Will watch your condition.  Will get help right away if you are not doing well or get worse. Document Released: 11/21/2004 Document Revised: 08/13/2011 Document Reviewed: 03/22/2011 [facility] Patient Information 2015 [place], [place]. This information is not  intended to replace advice given to you by your health care provider. Make sure you discuss any questions you have with your health care provider.   Emergency Department Resource Guide 1) Find a Doctor and Pay Out of Pocket Although you won't have to find out who is covered by your insurance plan, it is a good idea to  ask around and get recommendations. You will then need to call the office and see if the doctor you have chosen will accept you as a new patient and what types of options they offer for patients who are self-pay. Some doctors offer discounts or will set up payment plans for their patients who do not have insurance, but you will need to ask so you aren't surprised when you get to your appointment.  2) Contact Your Local Health Department Not all health departments have doctors that can see patients for sick visits, but many do, so it is worth a call to see if yours does. If you don't know where your local health department is, you can check in your phone book. The CDC also has a tool to help you locate your state's health department, and many state websites also have listings of all of their local health departments.  3) Find a Walk-in Clinic If your illness is not likely to be very severe or complicated, you may want to try a walk in clinic. These are popping up all over the country in pharmacies, drugstores, and shopping centers. They're usually staffed by nurse practitioners or physician assistants that have been trained to treat common illnesses and complaints. They're usually fairly quick and inexpensive. However, if you have serious medical issues or chronic medical problems, these are probably not your best option.  No Primary Care Doctor: - Call Health Connect at  [phone] - they can help you locate a primary care doctor that  accepts your insurance, provides certain services, etc. - Physician Referral Service- [phone]  Chronic Pain Problems: Organization         Address  Phone   Notes  [facility]  [phone] Patients need to be referred by their primary care doctor.   Medication Assistance: Organization         Address  Phone   Notes  [facility] [address] [place], [place] [address] [phone]) [phone] --Must be a  resident of [facility] [place] [facility] [place] -- Must have NO insurance coverage whatsoever (no Medicaid/ Medicare, etc.) -- The pt. MUST have a primary care doctor that directs their care regularly and follows them in the community   MedAssist  [phone]   [employer]  [phone]    Agencies that provide inexpensive medical care: Organization         Address  Phone   Notes  [facility]  [phone]   [facility]    [phone]   [facility] [address] [place], [place] [address] [phone]   [facility] [address], [place] [phone]   Planned Parenthood    [phone]) [phone]   [facility]    [phone]   [facility] and [facility]  [address], [place] Phone:  [phone], Fax:  [phone] Hours of Operation:  9 am - 6 pm, M-F.  Also accepts Medicaid/Medicare and self-pay.  [facility]  [address],  [place] Phone: [phone], Fax: [phone]. Hours of Operation:  8:30 am - 5:30 pm, M-F.  Also accepts Medicaid and self-pay.  [facility] [place] [address] [place] Phone: [phone]   [facility] [address] Natasha Bence [place], [place] [phone], Ext. [phone] Mondays & Thursdays: 7-9 AM.  First 15 patients are seen on a first come, first serve basis.    Medicaid-accepting [facility] Providers:  Organization         Address  Phone   Notes  [facility] [address], [place] [phone] Also accepts self-pay patients.  [facility] [address] Laurell Josephs [place], [place]  [phone]   [facility] [address], [place] [phone]   [facility] [address] [place] [phone]   Renaye Rakers [address], [place]   [phone] Only accepts  [place] Access [place] patients after they have their name applied to their card.   Self-Pay (no insurance) in [facility]:  Organization         Address  Phone   Notes  Sickle Cell Patients, [facility] [address] [place], [place] [phone]   [facility] [address] [place], [place] [phone]   [facility] [place]  [address], [place] [phone]   [facility]/Dr. Osei-Bonsu  [address] [place] or [address], [place] [phone] Phone number for both Oracle and Portal locations is the same.  Urgent Medical and [facility] [address], [place] [phone]   [facility] [address], [place] or [address] [phone]   [facility] [address], [place] [phone], phone; [phone], fax Sees patients 1st and 3rd Saturday of every month.  Must not qualify for public or private insurance (i.e. Medicaid, Medicare, [place] Health Choice, Veterans' Benefits)  Household income should be no more than 200% of the poverty level The clinic cannot treat you if you are pregnant or think you are pregnant  Sexually transmitted diseases are not treated at the clinic.    Dental Care: Organization         Address  Phone  Notes  [facility] Department of [facility] [address] [place], [place] [phone] Accepts children up to age 1 who are enrolled in [place] or [place] Health Choice; pregnant women with a Medicaid card; and children who have applied for Medicaid or [place] Health Choice, but were declined, whose parents can pay a reduced fee at time of service.  [facility] of [facility]  [address], [place] [phone] Accepts children up to age 59 who are enrolled in [place] or [place] Health Choice; pregnant women  with a Medicaid card; and children who have applied for Medicaid or [place] Health Choice, but were declined, whose parents can pay a reduced fee at time of service.  [facility]  [address] [place], [place] [phone] Patients are seen by appointment only. Walk-ins are not accepted. [facility] will see patients 2 years of age and older. Monday - Tuesday (8am-5pm) Most Wednesdays (8:30-5pm) $30 per visit, cash only  [employer] [facility] [employer] PROGRAM  [address], [facility] [phone] Patients are seen by appointment only. Walk-ins are not accepted. [facility] will see patients 43 years of age and older. One Wednesday Evening (Monthly: Volunteer Based).  $30 per visit, cash only  [facility]  [phone] for adults; Children under age 724, call Graduate Pediatric Dentistry at [phone]. Children aged 584-14, please call [phone] to request a pediatric application.  Dental services are provided in all areas of dental care including fillings, crowns and bridges, complete and partial dentures, implants, gum treatment, root canals, and extractions. Preventive care is also provided. Treatment is provided to both adults and children. Patients are selected via a lottery and there is often a waiting list.   [facility] [address], [place]  [phone]) [phone] [email or web address]   [facility] [address], [place], [place] [phone], Ext. [phone] Second and Fourth Thursday of each month, opens at 6:30 AM; Clinic ends at 9 AM.  Patients are seen on a first-come first-served basis, and a limited number are seen during each clinic.   [facility]  [address], [place], [place] [phone]) [phone]   Eligibility Requirements You must have lived in [place], [place], or [place] counties for at least the last three months.   You cannot be eligible for state or federal sponsored [employer]healthcare  insurance, including CIGNAVeterans Administration, [place]icaid, or [employer] EntertainmentMedicare.   You generally cannot be eligible for healthcare insurance through your employer.    How to apply: Eligibility screenings are held every Tuesday and Wednesday afternoon from 1:00 pm until 4:00 pm. You do not need an appointment for the interview!  [facility] [address], [place], [place] [phone]   [facility]  [phone]   [facility]  [phone]   [facility]  [phone]    Behavioral Health Resources in the Community: Intensive Outpatient Programs Organization         Address  Phone  Notes  [facility] [address], [place], [place] [phone]   [facility] [address], [place], [place] [phone]   [facility] [address], [place], [place]  [phone]   [facility] [address],  [place], [place] [phone] or [phone]   Substance Abuse Resources Organization         Address  Phone  Notes  [facility]  [phone]   [facility]  [phone]   [facility]  [phone]   [facility]  [phone]   Residential & Outpatient Substance Abuse Program  [phone]   Psychological Services Organization         Address  Phone  Notes  [facility]  [phone]- [phone]   [facility]  [phone]- [phone]   [facility] [address], [place] [phone] or [phone]    Mobile Crisis Teams Organization         Address  Phone  Notes  [facility], Mobile Crisis Care Unit  [phone]   [facility]  [address] [place], [place] [phone]   Doristine LocksSharon DeEsch [address] [place] [phone]    Self-Help/Support Groups Organization         Address  Phone             Notes  [facility]  [facility] [place] - variety  of support groups  [phone] Call for more information  Narcotics Anonymous (NA), [facility] [address], [employer] [place]  2 meetings at this location   Residential Sports administrator         Address  Phone  Notes  [facility] [address],    [place]  [phone]   [facility]  [address] [place] [address], [place], [place] [phone]   [facility] [address] [place], [place] [phone] Admissions: 8am-3pm M-F  [facility] [address],    [place], [place] [phone]   [facility] [address] [place], [place], [place] [phone]   [facility] [address]  [place], [place] [phone]   [facility] - Intensive Outpatient [address] Laurell Josephs [address], [place], [place] [phone]   [facility] ([facility] [address] [place]  [place], [place] [phone] or [phone]   [facility] ([facility]) [address] [place], [place] [phone] Accepts Medicaid  [facility] [place] [address]  [place] [phone] Substance Abuse/Addiction Treatment   [facility] Organization         Address  Phone  Notes  [facility]  [phone]   Angie Fava, PhD [address] Ervin Knack [place], [place]   [phone] or [phone]   [facility]   [address] [place], [place] [phone]   [facility] [address], [place], [place] [phone] Insurance/Medicaid/sponsorship through [facility] [address]                                    [place], [place] [phone] Therapy/tele-psych/case  [facility] [address], [place] [phone]    Dr. Lolly Mustache  [phone]   [facility]  [employer] [facility] Dept. 1) [address],  [place] 2) [address] [place] 3)  [address], [place] [phone]  [phone]   [facility] Child Abuse Hotline [phone] or [phone] (After Hours)

## 2014-05-24 LAB — URINE CULTURE

## 2014-05-25 ENCOUNTER — Telehealth (HOSPITAL_BASED_OUTPATIENT_CLINIC_OR_DEPARTMENT_OTHER): Payer: Self-pay | Admitting: Emergency Medicine

## 2014-05-25 NOTE — Telephone Encounter (Signed)
Post ED Visit - Positive Culture Follow-up  Culture report reviewed by antimicrobial stewardship pharmacist: []  Wes Dulaney, Pharm.D., BCPS []  Celedonio MiyamotoJeremy Frens, Pharm.D., BCPS []  Georgina PillionElizabeth Martin, Pharm.D., BCPS []  [place] CityMinh Pham, [address] BoulevardPharm.D., BCPS, AAHIVP [x]  Estella HuskMichelle Turner, Pharm.D., BCPS, AAHIVP []  Elder CyphersLorie Poole, [address] BoulevardPharm.D., BCPS  Positive urine culture Staphylococcus Treated with cephalexin, organism sensitive to the same and no further patient follow-up is required at this time.  Berle MullMiller, Windie Marasco 05/25/2014, 12:30 PM

## 2015-07-28 ENCOUNTER — Encounter (HOSPITAL_BASED_OUTPATIENT_CLINIC_OR_DEPARTMENT_OTHER): Payer: Self-pay | Admitting: Emergency Medicine

## 2015-07-28 ENCOUNTER — Emergency Department (HOSPITAL_BASED_OUTPATIENT_CLINIC_OR_DEPARTMENT_OTHER): Payer: BLUE CROSS/BLUE SHIELD

## 2015-07-28 ENCOUNTER — Emergency Department (HOSPITAL_BASED_OUTPATIENT_CLINIC_OR_DEPARTMENT_OTHER)
Admission: EM | Admit: 2015-07-28 | Discharge: 2015-07-28 | Disposition: A | Discharge status: Home / Self Care | Payer: BLUE CROSS/BLUE SHIELD | Attending: Emergency Medicine | Admitting: Emergency Medicine

## 2015-07-28 DIAGNOSIS — X58XXXA Exposure to other specified factors, initial encounter: Secondary | ICD-10-CM | POA: Insufficient documentation

## 2015-07-28 DIAGNOSIS — Y9389 Activity, other specified: Secondary | ICD-10-CM | POA: Insufficient documentation

## 2015-07-28 DIAGNOSIS — S29011A Strain of muscle and tendon of front wall of thorax, initial encounter: Secondary | ICD-10-CM | POA: Diagnosis not present

## 2015-07-28 DIAGNOSIS — Y929 Unspecified place or not applicable: Secondary | ICD-10-CM | POA: Insufficient documentation

## 2015-07-28 DIAGNOSIS — Y999 Unspecified external cause status: Secondary | ICD-10-CM | POA: Insufficient documentation

## 2015-07-28 DIAGNOSIS — S29001A Unspecified injury of muscle and tendon of front wall of thorax, initial encounter: Secondary | ICD-10-CM | POA: Diagnosis present

## 2015-07-28 MED ORDER — CYCLOBENZAPRINE HCL 10 MG PO TABS
5.0000 mg | ORAL_TABLET | Freq: Once | ORAL | Status: AC
  Administered 2015-07-28: 5 mg via ORAL
  Filled 2015-07-28: qty 1

## 2015-07-28 MED ORDER — HYDROCODONE-ACETAMINOPHEN 5-325 MG PO TABS: 1.0000 | ORAL_TABLET | Freq: Four times a day (QID) | ORAL | Status: AC | PRN

## 2015-07-28 MED ORDER — OXYCODONE-ACETAMINOPHEN 5-325 MG PO TABS
1.0000 | ORAL_TABLET | Freq: Once | ORAL | Status: AC
  Administered 2015-07-28: 1 via ORAL
  Filled 2015-07-28: qty 1

## 2015-07-28 MED ORDER — CYCLOBENZAPRINE HCL 5 MG PO TABS: 5.0000 mg | ORAL_TABLET | Freq: Three times a day (TID) | ORAL | Status: AC | PRN

## 2015-07-28 MED ORDER — NAPROXEN 500 MG PO TABS: 500.0000 mg | ORAL_TABLET | Freq: Two times a day (BID) | ORAL | Status: AC

## 2015-07-28 MED ORDER — IBUPROFEN 800 MG PO TABS
800.0000 mg | ORAL_TABLET | Freq: Once | ORAL | Status: AC
  Administered 2015-07-28: 800 mg via ORAL
  Filled 2015-07-28: qty 1

## 2015-07-28 NOTE — ED Provider Notes (Signed)
CSN: [id number]     Arrival date & time 07/28/15  0020 History   First MD Initiated Contact with Patient 07/28/15 [phone]     Chief Complaint  Patient presents with  . Chest Pain     (Consider location/radiation/quality/duration/timing/severity/associated sxs/prior Treatment) The history is provided by the patient.  Stacey Hancock is a 45 y.o. female here presenting with right-sided chest pain. Patient states that she is busy moving today and has been picking up heavy furniture. Patient states that around 10 PM tonight she was trying to go to bed and had some right-sided chest pain with some cramps. She denies any radiation to the pain. She states that it hurts more when she tries to take a deep breath. She denies any recent travel or leg swelling or history of blood clots. She states that she is otherwise healthy.    History reviewed. No pertinent past medical history. History reviewed. No pertinent past surgical history. History reviewed. No pertinent family history. Social History  Substance Use Topics  . Smoking status: Never Smoker   . Smokeless tobacco: None  . Alcohol Use: Yes   OB History    No data available     Review of Systems  Cardiovascular: Positive for chest pain.  All other systems reviewed and are negative.     Allergies  Review of patient's allergies indicates no known allergies.  Home Medications   Prior to Admission medications   Medication Sig Start Date End Date Taking? Authorizing Provider  cephALEXin (KEFLEX) 500 MG capsule Take 1 capsule (500 mg total) by mouth 2 (two) times daily. 05/22/14   Nicole Pisciotta, PA-C  HYDROcodone-acetaminophen (NORCO) 5-325 MG per tablet Take 1-2 tablets by mouth every 6 (six) hours as needed for pain. 12/08/11   Linwood Dibbles, MD  metroNIDAZOLE (FLAGYL) 500 MG tablet Take 4 tablets (2,000 mg total) by mouth once. 12/08/11   Linwood Dibbles, MD  naproxen (NAPROSYN) 500 MG tablet Take 1 tablet (500 mg total) by mouth 2 (two)  times daily. 12/08/11   Linwood Dibbles, MD  phenazopyridine (PYRIDIUM) 200 MG tablet Take 1 tablet (200 mg total) by mouth 3 (three) times daily. 05/22/14   Nicole Pisciotta, PA-C   BP 104/78 mmHg  Pulse 71  Temp(Src) 98.7 F (37.1 C) (Oral)  Resp 9  Ht  (1.702 m)  Wt 216 lb (97.977 kg)  BMI 33.82 kg/m2  SpO2 100%  LMP 07/28/2015 Physical Exam  Constitutional: She is oriented to person, place, and time. She appears well-developed and well-nourished.  Uncomfortable   HENT:  Head: Normocephalic.  Mouth/Throat: Oropharynx is clear and moist.  Eyes: Conjunctivae are normal. Pupils are equal, round, and reactive to light.  Neck: Normal range of motion. Neck supple.  Cardiovascular: Normal rate, regular rhythm and normal heart sounds.   Pulmonary/Chest: Effort normal and breath sounds normal.  Reproducible R sided tenderness, no midline tenderness.   Abdominal: Soft. Bowel sounds are normal. She exhibits no distension. There is no tenderness. There is no rebound.  Musculoskeletal: Normal range of motion. She exhibits no edema or tenderness.  Neurological: She is alert and oriented to person, place, and time.  Skin: Skin is warm and dry.  Psychiatric: She has a normal mood and affect. Her behavior is normal. Judgment and thought content normal.  Nursing note and vitals reviewed.   ED Course  Procedures (including critical care time) Labs Review Labs Reviewed - No data to display  Imaging Review Dg Chest 2 View  07/28/2015  CLINICAL DATA:  Right-sided chest pain for 3 hours. EXAM: CHEST  2 VIEW COMPARISON:  None. FINDINGS: The cardiomediastinal contours are normal. The lungs are clear. Pulmonary vasculature is normal. No consolidation, pleural effusion, or pneumothorax. No acute osseous abnormalities are seen. IMPRESSION: No acute pulmonary process. Electronically Signed   By: Rubye OaksMelanie  Ehinger M.D.   On: 07/28/2015 01:36   I have personally reviewed and evaluated these images and lab  results as part of my medical decision-making.   EKG Interpretation   Date/Time:  Friday July 28 2015 00:36:58 EDT Ventricular Rate:  83 PR Interval:  165 QRS Duration: 80 QT Interval:  380 QTC Calculation: 446 R Axis:   56 Text Interpretation:  Sinus rhythm Baseline wander in lead(s) V3 V5 No  previous ECGs available Confirmed by Luria Rosario  MD, Paislei Dorval ([id number]) on 07/28/2015  12:39:49 AM      MDM   Final diagnoses:  None    Stacey Hancock is a 45 y.o. female here with R sided chest pain that is reproducible. Likely muscle strain from picking up heavy items today. EKG unremarkable. Not tachycardic and I doubt PE. Ordered blood work initially but patient refused to be stuck after several attempts. Low suspicion for ACS. Given motrin, percocet, flexeril and felt better. Will dc home.      Richardean Canalavid H Delray Reza, MD 07/28/15 [phone]

## 2015-07-28 NOTE — Discharge Instructions (Signed)
Take motrin for pain.   Take flexeril for muscle spasms.   Take norco for severe pain.   See your doctor  Avoid heavy lifting   Return to ER if you have worse chest pain, shortness of breath, trouble breathing

## 2015-07-28 NOTE — ED Notes (Signed)
RR on monitor low, Patient visualize with RR of 16

## 2015-07-28 NOTE — ED Notes (Signed)
Attempted 1 stick for Blood, the patient crying and anxious through the whole process. When unable to attempt blood explained to patient that another nurse would be in to attempt. The patient then states " well I refuse any blood work, if its my heart its my heart and you will have to find out another way. If you knew that there was someone better why didn't you get them to start with I told you that it burned and hurt and I was a hard stick."  Patient was very upset that this RN was unable to obtain the blood work. MD aware and blood work cancelled

## 2015-07-28 NOTE — ED Notes (Signed)
Patient states that she has had pain to her right chest since about 1030 this night

## 2016-04-03 ENCOUNTER — Encounter (HOSPITAL_BASED_OUTPATIENT_CLINIC_OR_DEPARTMENT_OTHER): Payer: Self-pay | Admitting: *Deleted

## 2016-04-03 ENCOUNTER — Emergency Department (HOSPITAL_BASED_OUTPATIENT_CLINIC_OR_DEPARTMENT_OTHER)
Admission: EM | Admit: 2016-04-03 | Discharge: 2016-04-03 | Discharge status: Against Medical Advice | Payer: BLUE CROSS/BLUE SHIELD | Attending: Emergency Medicine | Admitting: Emergency Medicine

## 2016-04-03 DIAGNOSIS — R202 Paresthesia of skin: Secondary | ICD-10-CM | POA: Diagnosis not present

## 2016-04-03 DIAGNOSIS — R51 Headache: Secondary | ICD-10-CM | POA: Insufficient documentation

## 2016-04-03 DIAGNOSIS — R0789 Other chest pain: Secondary | ICD-10-CM | POA: Diagnosis not present

## 2016-04-03 DIAGNOSIS — Z79899 Other long term (current) drug therapy: Secondary | ICD-10-CM | POA: Insufficient documentation

## 2016-04-03 DIAGNOSIS — R200 Anesthesia of skin: Secondary | ICD-10-CM | POA: Diagnosis present

## 2016-04-03 HISTORY — DX: Other chronic pain: G89.29

## 2016-04-03 HISTORY — DX: Migraine, unspecified, not intractable, without status migrainosus: G43.909

## 2016-04-03 HISTORY — DX: Low back pain: M54.5

## 2016-04-03 HISTORY — DX: Low back pain, unspecified: M54.50

## 2016-04-03 NOTE — ED Notes (Signed)
ED Provider at bedside. 

## 2016-04-03 NOTE — ED Notes (Signed)
Pt left ama  Refused blood work or ct, rn attempted iv pt stated that it burned please remove it, which rn did,  2nd rn went to start iv w us  Saw good vein but pt refused to allow iv attempt, pt stated that rn did not introduce self, which RT heard rn do  Pt stated that all staff was rude refused to have any test run  md told pt that he had not ruled out any thing

## 2016-04-03 NOTE — ED Notes (Addendum)
Attempted IV x 1 pt requested that RN stop trying as it was painful.second RN will assess IV site.

## 2016-04-03 NOTE — ED Provider Notes (Signed)
[facility]   CSN: [id number] Arrival date & time: 04/03/16  0200     History   Chief Complaint Chief Complaint  Patient presents with  . Chest Pain    HPI Waldron SessionSaundra A Hancock is a 46 y.o. female.  Patient presents with a three-day history of intermittent numbness in her left arm. She reports numbness and tingling starts in her left pinky finger that involves her entire hand and arm although it to her shoulder. Symptoms last a few minutes then resolved. They've been coming and going for the past 3 days. Today she had an episode of entire left arm numbness that lasted about 25 minutes prior to arrival. This is associated with a gradual onset headache that she thinks is a migraine that she's never been formally diagnosed with migraines. She denies any focal weakness in her arm. She denies difficulty speaking or swallowing. Denies any weakness or numbness in her left leg. She is concerned she could be having a stroke so she decided to come to the ED. On the way here she developed left-sided chest pain for a few minutes which is since resolved. She feels anxious and is to have a headache. She denies headache previously with her other episodes of arm numbness. Denies any neck or back pain or trauma. Denies any history of heart or lung problems.   The history is provided by the patient.  Chest Pain   Associated symptoms include headaches and numbness. Pertinent negatives include no abdominal pain, no back pain, no cough, no dizziness, no fever, no nausea, no shortness of breath, no vomiting and no weakness.    Past Medical History:  Diagnosis Date  . Chronic left-sided low back pain without sciatica   . Migraine     There are no active problems to display for this patient.   History reviewed. No pertinent surgical history.  OB History    No data available       Home Medications    Prior to Admission medications   Medication Sig Start Date End Date  Taking? Authorizing Provider  cephALEXin (KEFLEX) 500 MG capsule Take 1 capsule (500 mg total) by mouth 2 (two) times daily. 05/22/14   Nicole Pisciotta, PA-C  cyclobenzaprine (FLEXERIL) 5 MG tablet Take 1 tablet (5 mg total) by mouth 3 (three) times daily as needed for muscle spasms. 07/28/15   Charlynne Panderavid Hsienta Yao, MD  HYDROcodone-acetaminophen (NORCO) 5-325 MG tablet Take 1-2 tablets by mouth every 6 (six) hours as needed. 07/28/15   Charlynne Panderavid Hsienta Yao, MD  metroNIDAZOLE (FLAGYL) 500 MG tablet Take 4 tablets (2,000 mg total) by mouth once. 12/08/11   Linwood DibblesJon Knapp, MD  naproxen (NAPROSYN) 500 MG tablet Take 1 tablet (500 mg total) by mouth 2 (two) times daily. 07/28/15   Charlynne Panderavid Hsienta Yao, MD  phenazopyridine (PYRIDIUM) 200 MG tablet Take 1 tablet (200 mg total) by mouth 3 (three) times daily. 05/22/14   Joni ReiningNicole Pisciotta, PA-C    Family History No family history on file.  Social History Social History  Substance Use Topics  . Smoking status: Never Smoker  . Smokeless tobacco: Not on file  . Alcohol use Yes     Allergies   Patient has no known allergies.   Review of Systems Review of Systems  Constitutional: Negative for activity change, appetite change and fever.  HENT: Negative for congestion and rhinorrhea.   Respiratory: Negative for cough, chest tightness and shortness of breath.   Cardiovascular: Positive for chest  pain.  Gastrointestinal: Negative for abdominal pain, nausea and vomiting.  Genitourinary: Negative for dysuria, flank pain and hematuria.  Musculoskeletal: Negative for arthralgias, back pain, myalgias and neck pain.  Neurological: Positive for numbness and headaches. Negative for dizziness, syncope, speech difficulty, weakness and light-headedness.   A complete 10 system review of systems was obtained and all systems are negative except as noted in the HPI and PMH.   Physical Exam Updated Vital Signs BP 142/96 (BP Location: Right Arm)   Pulse 80   Temp 98.1 F (36.7 C)  (Oral)   Resp 18   Ht 5\' 6"  (1.676 m)   Wt 200 lb (90.7 kg)   LMP 03/27/2016 (Approximate)   SpO2 100%   BMI 32.28 kg/m   Physical Exam  Constitutional: She is oriented to person, place, and time. She appears well-developed and well-nourished. No distress.  Flat affect  HENT:  Head: Normocephalic and atraumatic.  Mouth/Throat: Oropharynx is clear and moist. No oropharyngeal exudate.  Eyes: Conjunctivae and EOM are normal. Pupils are equal, round, and reactive to light.  Neck: Normal range of motion. Neck supple.  No meningismus.  Cardiovascular: Normal rate, regular rhythm, normal heart sounds and intact distal pulses.   No murmur heard. Pulmonary/Chest: Effort normal and breath sounds normal. No respiratory distress. She exhibits tenderness.  L chest wall tenderness  Abdominal: Soft. There is no tenderness. There is no rebound and no guarding.  Musculoskeletal: Normal range of motion. She exhibits no edema or tenderness.  Neurological: She is alert and oriented to person, place, and time. No cranial nerve deficit. She exhibits normal muscle tone. Coordination normal.  No ataxia on finger to nose bilaterally. No pronator drift. 5/5 strength throughout. CN 2-12 intact.Equal grip strength. Sensation intact.  No appreciable sensory deficits.  Intact radial pulses  Skin: Skin is warm.  Psychiatric: She has a normal mood and affect. Her behavior is normal.  Nursing note and vitals reviewed.    ED Treatments / Results  Labs (all labs ordered are listed, but only abnormal results are displayed) Labs Reviewed - No data to display  EKG  EKG Interpretation  Date/Time:  Wednesday April 03 2016 02:18:18 EST Ventricular Rate:  80 PR Interval:    QRS Duration: 77 QT Interval:  359 QTC Calculation: 415 R Axis:   38 Text Interpretation:  Sinus rhythm No significant change was found Confirmed by Manus Gunning  MD, Chaunda Vandergriff ([id number]) on 04/03/2016 2:23:42 AM       Radiology No results  found.  Procedures Procedures (including critical care time)  Medications Ordered in ED Medications - No data to display   Initial Impression / Assessment and Plan / ED Course  I have reviewed the triage vital signs and the nursing notes.  Pertinent labs & imaging results that were available during my care of the patient were reviewed by me and considered in my medical decision making (see chart for details).     Intermittent left arm numbness for the past 3 days. Denies neck or back pain. Has a headache now which is similar to her previous self diagnosed migraines. Episode of chest pain which is now resolved.  EKG is normal sinus rhythm. Patient has a nonfocal neurological exam without appreciable deficits motor or sensory.  Patient states she does not want any medications or diagnostic testing. She declines any blood work or IV placement. She refuses CT head or chest x-ray. States she just wants to go home.  Patient appears to have capacity to  make medical decisions. She will leave AGAINST MEDICAL ADVICE. Advised patient that life threatening conditions including heart attack and stroke have not been ruled out.  Patient requesting to leave against medical advice. She is alert and oriented x3 and appears to have decision making capacity. Denies suicidal or homicidal ideation.  Discussed that admission and further testing is recommended and emergent medical conditions have not been ruled out. Discussed that leaving against medical advice may result in clinical deterioration and possible death.   Final Clinical Impressions(s) / ED Diagnoses   Final diagnoses:  Arm paresthesia, left    New Prescriptions New Prescriptions   No medications on file     Glynn Octave, MD 04/03/16 [phone]

## 2016-04-03 NOTE — ED Notes (Signed)
Pt reports migraine HA left arm numbness and chest pain. Tried to explain to pt that in order to rule out life threatening medical conditions we would need to collect blood and perform test as well as start an IV, pt rudely  stated that she did not have to have an IV and that she was not going to be a pin cushion and "if you are not good enough to get it in one try then you need to leave", this RN left room to allow another RN to care for pt

## 2016-04-03 NOTE — ED Triage Notes (Signed)
C/o left arm numbness off and on x 3 days  States starts in little finger then moves up to upper arm ,  On way here started having left upper chest pain  (pressure)  States worse w movement  Denies n/v, no sob

## 2017-02-26 IMAGING — DX DG CHEST 2V
2 series · 2 of 2 positions shown · non-contrast
Comparison: None.

CLINICAL DATA: Right-sided chest pain for 3 hours.

EXAM:
CHEST  2 VIEW

[chest pa]
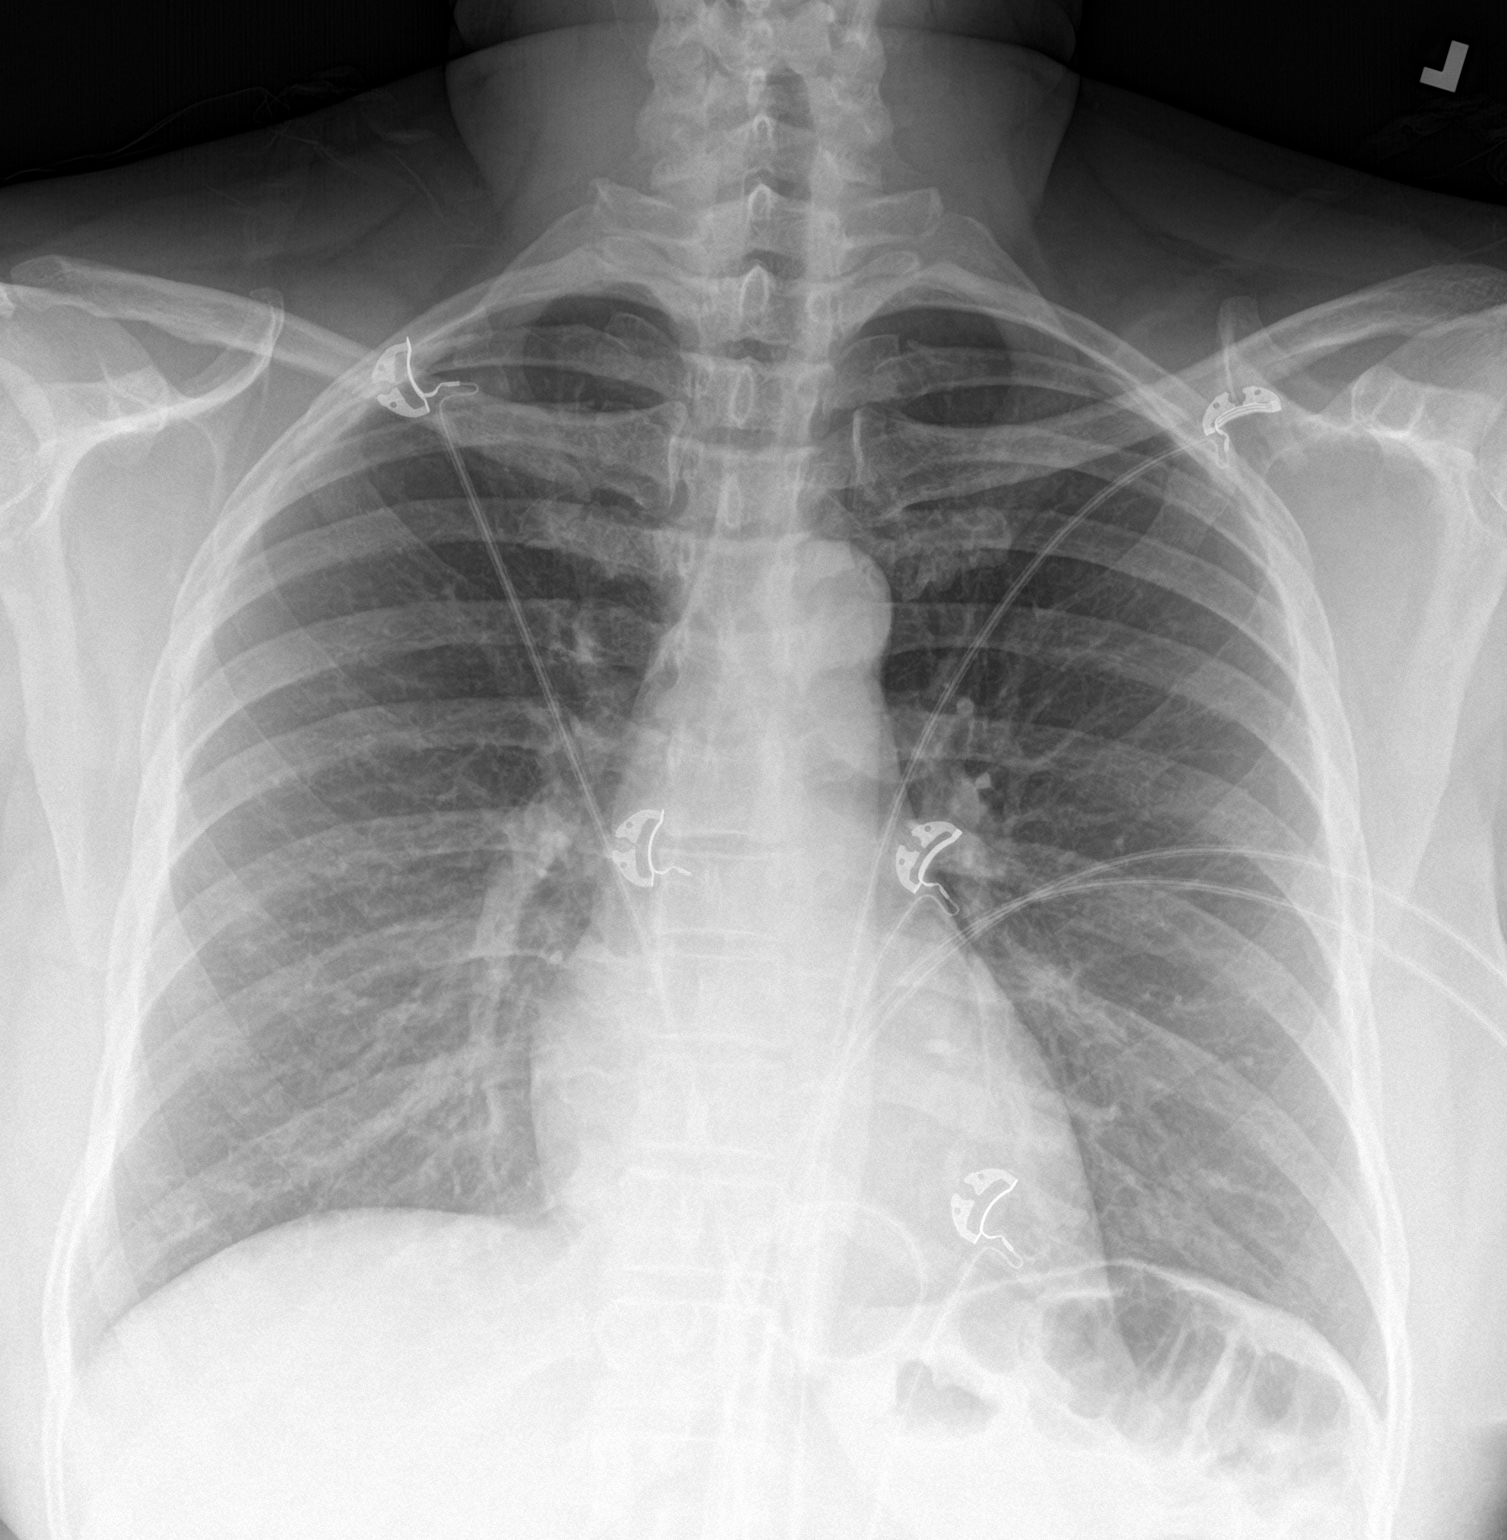

[chest lat]
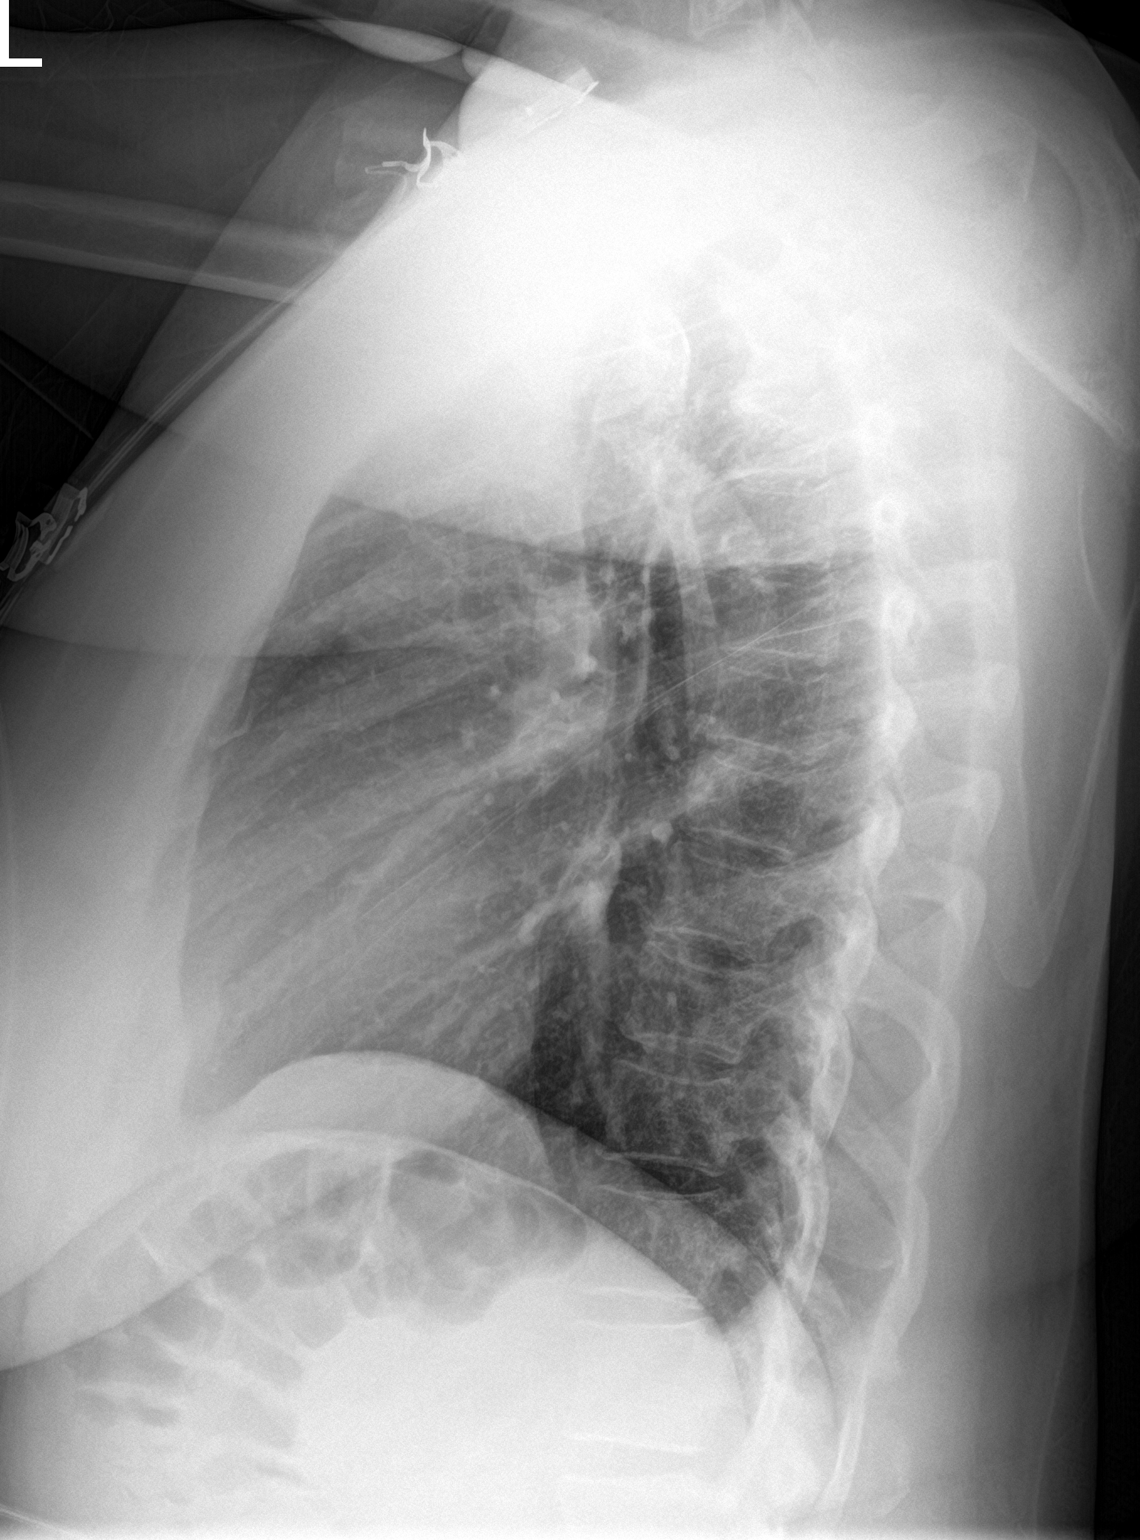

[2 of 2 positions shown; findings below may reference images not displayed]

FINDINGS: The cardiomediastinal contours are normal. The lungs are clear.
Pulmonary vasculature is normal. No consolidation, pleural effusion,
or pneumothorax. No acute osseous abnormalities are seen.
IMPRESSION: No acute pulmonary process.
